# Patient Record
Sex: Female | Born: 1985 | Race: Black or African American | Hispanic: No | Marital: Single | Smoking: Current some day smoker
Health system: Southern US, Community
[De-identification: ages and names within clinical notes are randomized; demographics above are authoritative.]

## PROBLEM LIST (undated history)

## (undated) DIAGNOSIS — N289 Disorder of kidney and ureter, unspecified: Secondary | ICD-10-CM

## (undated) HISTORY — PX: TUBAL LIGATION: SHX77

---

## 2013-09-05 ENCOUNTER — Emergency Department (HOSPITAL_COMMUNITY): Payer: Medicaid Other

## 2013-09-05 ENCOUNTER — Emergency Department (HOSPITAL_COMMUNITY)
Admission: EM | Admit: 2013-09-05 | Discharge: 2013-09-05 | Disposition: A | Discharge status: Home / Self Care | Payer: Medicaid Other | Attending: Emergency Medicine | Admitting: Emergency Medicine

## 2013-09-05 ENCOUNTER — Encounter (HOSPITAL_COMMUNITY): Payer: Self-pay | Admitting: Emergency Medicine

## 2013-09-05 DIAGNOSIS — R110 Nausea: Secondary | ICD-10-CM | POA: Insufficient documentation

## 2013-09-05 DIAGNOSIS — Z880 Allergy status to penicillin: Secondary | ICD-10-CM | POA: Insufficient documentation

## 2013-09-05 DIAGNOSIS — Z3202 Encounter for pregnancy test, result negative: Secondary | ICD-10-CM | POA: Insufficient documentation

## 2013-09-05 DIAGNOSIS — D72829 Elevated white blood cell count, unspecified: Secondary | ICD-10-CM

## 2013-09-05 DIAGNOSIS — N12 Tubulo-interstitial nephritis, not specified as acute or chronic: Secondary | ICD-10-CM

## 2013-09-05 LAB — COMPREHENSIVE METABOLIC PANEL
ALT: 10 U/L (ref 0–35)
AST: 15 U/L (ref 0–37)
Albumin: 3.7 g/dL (ref 3.5–5.2)
Alkaline Phosphatase: 67 U/L (ref 39–117)
BILIRUBIN TOTAL: 1.2 mg/dL (ref 0.3–1.2)
BUN: 8 mg/dL (ref 6–23)
CO2: 22 meq/L (ref 19–32)
CREATININE: 1 mg/dL (ref 0.50–1.10)
Calcium: 9.3 mg/dL (ref 8.4–10.5)
Chloride: 101 mEq/L (ref 96–112)
GFR, EST AFRICAN AMERICAN: 90 mL/min — AB (ref >90)
GFR, EST NON AFRICAN AMERICAN: 78 mL/min — AB (ref >90)
Glucose, Bld: 94 mg/dL (ref 70–99)
Potassium: 3.9 mEq/L (ref 3.7–5.3)
Sodium: 138 mEq/L (ref 137–147)
Total Protein: 7.7 g/dL (ref 6.0–8.3)

## 2013-09-05 LAB — CBC WITH DIFFERENTIAL/PLATELET
BASOS ABS: 0 10*3/uL (ref 0.0–0.1)
Basophils Relative: 0 % (ref 0–1)
EOS PCT: 0 % (ref 0–5)
Eosinophils Absolute: 0 10*3/uL (ref 0.0–0.7)
HEMATOCRIT: 35 % — AB (ref 36.0–46.0)
Hemoglobin: 11.3 g/dL — ABNORMAL LOW (ref 12.0–15.0)
LYMPHS PCT: 12 % (ref 12–46)
Lymphs Abs: 1.4 10*3/uL (ref 0.7–4.0)
MCH: 26.6 pg (ref 26.0–34.0)
MCHC: 32.3 g/dL (ref 30.0–36.0)
MCV: 82.4 fL (ref 78.0–100.0)
Monocytes Absolute: 1.5 10*3/uL — ABNORMAL HIGH (ref 0.1–1.0)
Monocytes Relative: 13 % — ABNORMAL HIGH (ref 3–12)
Neutro Abs: 8.8 10*3/uL — ABNORMAL HIGH (ref 1.7–7.7)
Neutrophils Relative %: 75 % (ref 43–77)
Platelets: 272 10*3/uL (ref 150–400)
RBC: 4.25 MIL/uL (ref 3.87–5.11)
RDW: 14.1 % (ref 11.5–15.5)
WBC: 11.7 10*3/uL — ABNORMAL HIGH (ref 4.0–10.5)

## 2013-09-05 LAB — LIPASE, BLOOD: Lipase: 20 U/L (ref 11–59)

## 2013-09-05 LAB — URINALYSIS, ROUTINE W REFLEX MICROSCOPIC
Glucose, UA: NEGATIVE mg/dL
KETONES UR: 40 mg/dL — AB
Nitrite: POSITIVE — AB
PH: 5 (ref 5.0–8.0)
PROTEIN: 100 mg/dL — AB
Specific Gravity, Urine: 1.015 (ref 1.005–1.030)
Urobilinogen, UA: >8 mg/dL — ABNORMAL HIGH (ref 0.0–1.0)

## 2013-09-05 LAB — URINE MICROSCOPIC-ADD ON

## 2013-09-05 LAB — POC URINE PREG, ED: PREG TEST UR: NEGATIVE

## 2013-09-05 MED ORDER — DEXTROSE 5 % IV SOLN
1.0000 g | Freq: Once | INTRAVENOUS | Status: AC
  Administered 2013-09-05: 1 g via INTRAVENOUS
  Filled 2013-09-05: qty 10

## 2013-09-05 MED ORDER — FENTANYL CITRATE 0.05 MG/ML IJ SOLN
50.0000 ug | Freq: Once | INTRAMUSCULAR | Status: AC
  Administered 2013-09-05: 50 ug via INTRAVENOUS
  Filled 2013-09-05: qty 2

## 2013-09-05 MED ORDER — ONDANSETRON 4 MG PO TBDP: ORAL_TABLET | ORAL | Status: DC

## 2013-09-05 MED ORDER — CEPHALEXIN 500 MG PO CAPS: 500.0000 mg | ORAL_CAPSULE | Freq: Three times a day (TID) | ORAL | Status: DC

## 2013-09-05 MED ORDER — SODIUM CHLORIDE 0.9 % IV BOLUS (SEPSIS)
1000.0000 mL | Freq: Once | INTRAVENOUS | Status: AC
  Administered 2013-09-05: 1000 mL via INTRAVENOUS

## 2013-09-05 MED ORDER — ONDANSETRON HCL 4 MG/2ML IJ SOLN
4.0000 mg | Freq: Once | INTRAMUSCULAR | Status: AC
  Administered 2013-09-05: 4 mg via INTRAVENOUS
  Filled 2013-09-05: qty 2

## 2013-09-05 MED ORDER — OXYCODONE-ACETAMINOPHEN 5-325 MG PO TABS: 1.0000 | ORAL_TABLET | ORAL | Status: DC | PRN

## 2013-09-05 NOTE — ED Notes (Signed)
Pt encouraged to void when able. Pt reports voided prior to arrival.

## 2013-09-05 NOTE — ED Provider Notes (Signed)
CSN: [id number]     Arrival date & time 09/05/13  1126 History   First MD Initiated Contact with Patient 09/05/13 1151     Chief Complaint  Patient presents with  . Flank Pain     (Consider location/radiation/quality/duration/timing/severity/associated sxs/prior Treatment) HPI Comments: 28 year old female presents with severe right-sided back pain. She's been on for the past week. She does have some pain on the left side but is not as bad as the right. She has a history of kidney infection several years ago. She did have body aches, nausea, and chills. She vomited once yesterday. She states a friend states she felt hot yesterday but did not take her temperature. She doesn't have some dysuria but mostly describes as feeling like she still needs to urinate. She's had a mild suprapubic pain as well. She's been taking Alka-Seltzer without any relief. She also started taking Azo without any relief. She notes that her urine has been cloudy. No cough, shortness of breath, or chest pain.   Past Medical History  Diagnosis Date  . Kidney infection    Past Surgical History  Procedure Laterality Date  . Tubal ligation     History reviewed. No pertinent family history. History  Substance Use Topics  . Smoking status: Never Smoker   . Smokeless tobacco: Not on file  . Alcohol Use: No   OB History   Grav Para Term Preterm Abortions TAB SAB Ect Mult Living                 Review of Systems  Constitutional: Positive for chills.  Respiratory: Negative for cough and shortness of breath.   Cardiovascular: Negative for chest pain.  Gastrointestinal: Positive for nausea and abdominal pain. Negative for diarrhea.  Genitourinary: Positive for dysuria, frequency and flank pain. Negative for hematuria and vaginal bleeding.  Musculoskeletal: Positive for back pain.      Allergies  Morphine and related and Penicillins  Home Medications   Current Outpatient Rx  Name  Route  Sig  Dispense  Refill   . DM-Phenylephrine-Acetaminophen (ALKA-SELTZER PLUS DAY COLD/FLU) 10-5-325 MG CAPS   Oral   Take 2 capsules by mouth 2 (two) times daily as needed. Congestion         . phenazopyridine (AZO-STANDARD) 95 MG tablet   Oral   Take 190 mg by mouth 3 (three) times daily as needed for pain.          BP 91/74  Pulse 110  Temp(Src) 98.5 F (36.9 C) (Oral)  Resp 16  SpO2 100%  LMP 08/22/2013 Physical Exam  Vitals reviewed. Constitutional: She is oriented to person, place, and time. She appears well-developed and well-nourished.  Appears uncomfortable, rolling in bed  HENT:  Head: Normocephalic and atraumatic.  Right Ear: External ear normal.  Left Ear: External ear normal.  Nose: Nose normal.  Eyes: Right eye exhibits no discharge. Left eye exhibits no discharge.  Cardiovascular: Normal rate, regular rhythm and normal heart sounds.   Pulmonary/Chest: Effort normal and breath sounds normal.  Abdominal: Soft. She exhibits no distension. There is no tenderness. There is CVA tenderness (right greater than left).  Neurological: She is alert and oriented to person, place, and time.  Skin: Skin is warm and dry.    ED Course  Procedures (including critical care time) Labs Review Labs Reviewed  CBC WITH DIFFERENTIAL - Abnormal; Notable for the following:    WBC 11.7 (*)    Hemoglobin 11.3 (*)    HCT 35.0 (*)  Neutro Abs 8.8 (*)    Monocytes Relative 13 (*)    Monocytes Absolute 1.5 (*)    All other components within normal limits  COMPREHENSIVE METABOLIC PANEL - Abnormal; Notable for the following:    GFR calc non Af Amer 78 (*)    GFR calc Af Amer 90 (*)    All other components within normal limits  URINALYSIS, ROUTINE W REFLEX MICROSCOPIC - Abnormal; Notable for the following:    Color, Urine RED (*)    APPearance TURBID (*)    Hgb urine dipstick TRACE (*)    Bilirubin Urine MODERATE (*)    Ketones, ur 40 (*)    Protein, ur 100 (*)    Urobilinogen, UA >8.0 (*)     Nitrite POSITIVE (*)    Leukocytes, UA LARGE (*)    All other components within normal limits  URINE MICROSCOPIC-ADD ON - Abnormal; Notable for the following:    Bacteria, UA MANY (*)    All other components within normal limits  URINE CULTURE  LIPASE, BLOOD  POC URINE PREG, ED   Imaging Review Ct Abdomen Pelvis Wo Contrast  09/05/2013   CLINICAL DATA:  Right flank pain for 2 weeks, nausea, vomiting, hematuria  EXAM: CT ABDOMEN AND PELVIS WITHOUT CONTRAST  TECHNIQUE: Multidetector CT imaging of the abdomen and pelvis was performed following the standard protocol without intravenous contrast. Sagittal and coronal MPR images reconstructed from axial data set.  COMPARISON:  None  FINDINGS: Minimal subsegmental atelectasis right lung base.  No urinary tract calcification, hydronephrosis or ureteral dilatation.  Bladder decompressed, unremarkable.  Within limits of a nonenhanced exam no focal abnormalities of the liver, spleen, pancreas, kidneys, or adrenal glands.  Minimal pericardial fluid.  Post tubal ligation.  Uterus, adnexae and appendix otherwise unremarkable.  Minimal free pelvic fluid, potentially physiologic.  Multiple pelvic phleboliths.  Stomach and bowel loops normal appearance.  No mass, adenopathy, free fluid or inflammatory process.  IMPRESSION: No acute intra-abdominal or intrapelvic abnormalities.  Minimal pericardial effusion.   Electronically Signed   By: Ulyses SouthwardMark  Boles M.D.   On: 09/05/2013 13:29    EKG Interpretation   None       MDM   Final diagnoses:  Pyelonephritis    Patient rolling around and thrashing on my arrival. Given this with her asymmetric pain, a CT scan was obtained to rule out stone. There is no obvious kidney stone on CT scan. This consistent with pyelonephritis. She's not had any vomiting or fevers here. She was given fluids and one dose of IV Rocephin. She had more thrashing and screaming about 10/10, given pain meds and hospitalist consulted. After pain  meds patient calm and well appearing. Dr Ardyth HarpsHernandez evaluated and feels patient can go home, patient agreeable to trying outpatient abx and pain meds. I feel this is reasonable as well, especially since most of the time patient is calm and not in pain. Will d/c with abx, zofran, and pain meds.    Audree CamelScott T Reynaldo Rossman, MD 09/05/13 [phone]

## 2013-09-05 NOTE — ED Notes (Signed)
Patient transported to CT 

## 2013-09-05 NOTE — ED Notes (Signed)
Pt anxious and tearful at present time. Pt instructed to take deep breaths and given therapeutic conversation.

## 2013-09-05 NOTE — Progress Notes (Signed)
   CARE MANAGEMENT ED NOTE 09/05/2013  Patient:  Suzanne Mccormick,Suzanne Mccormick   Account Number:  [id number]  Date Initiated:  09/05/2013  Documentation initiated by:  Edd ArbourGIBBS,Julien Berryman  Subjective/Objective Assessment:   28 [place] pt c/o rt flank pain x 1 wk.  H/o kidney infection a few years ago.  C/o body aches, NV, lightheadedness, no appetite, chills, hot, etc.  States it hurts when she pees.     Subjective/Objective Assessment Detail:   anxious and tearful in ED EPIC lists address as [place] EDP dx pyelonephritis Pt has her two children younger than 7 with her.  Her son with a bruise near left eye from tripping over his "pigeon toe feet" per pt  UA red, turbid positive for nitrate, too numerous trace of hgb, 40 ketones protein 100 leukocytes large many bacteria wbc CBS wbc = 11.7     Action/Plan:   cm spoke with pt and provided below resources   Action/Plan Detail:   Anticipated DC Date:  09/05/2013     Status Recommendation to Physician:   Result of Recommendation:    Other ED Services  Consult Working Plan    DC Planning Services  PCP issues  Other  Outpatient Services - Pt will follow up    Choice offered to / List presented to:            Status of service:  Completed, signed off  ED Comments:   ED Comments Detail:  CM discussed and provided written information for medicaid & self pay pcps, importance of pcp for f/u care, www.needymeds.org, discounted pharmacies and other [place] resources such as financial assistance, DSS and health department  Reviewed resources for [place] self [place] [facility] like [facility], [facility] [address], [facility], [facility] clinics, [facility] plus others, [facility] out patient pharmacies and housing Pt voiced understanding and appreciation of resources provided  Provided [facility] contact information

## 2013-09-05 NOTE — Consult Note (Signed)
Requesting physician: Sherwood Gambler, EDP  Primary Care Physician: No primary provider on file.  Reason for consultation: Potential admission   History of Present Illness: Pleasant 28 y/o without PMH, presents today with a 3 day history of subjective fevers and chills, right sided flank-pain, dysuria and urgency. She has been given a diagnosis of pyelo in the ED. Not febrile in the ED, WBC count is 11.7, no emesis.  CT scan without contrast, without stone or other intraabdominal pathology. We are asked to see her in consultation for potential admission because of continued pain.  Allergies:   Allergies  Allergen Reactions  . Morphine And Related     hives  . Penicillins     hives      Past Medical History  Diagnosis Date  . Kidney infection     Past Surgical History  Procedure Laterality Date  . Tubal ligation      Scheduled Meds: Continuous Infusions: PRN Meds:.    Social History:  reports that she has never smoked. She has never used smokeless tobacco. She reports that she does not drink alcohol or use illicit drugs.  Family History  Problem Relation Age of Onset  . Hypertension Mother     Review of Systems:  Constitutional: Positive for subjective fever, chills, diaphoresis, appetite change and fatigue.  HEENT: Denies photophobia, eye pain, redness, hearing loss, ear pain, congestion, sore throat, rhinorrhea, sneezing, mouth sores, trouble swallowing, neck pain, neck stiffness and tinnitus.   Respiratory: Denies SOB, DOE, cough, chest tightness,  and wheezing.   Cardiovascular: Denies chest pain, palpitations and leg swelling.  Gastrointestinal: Denies nausea, vomiting, abdominal pain, diarrhea, constipation, blood in stool and abdominal distention.  Genitourinary: Positive for dysuria, urgency, frequency, , flank pain and difficulty urinating.  Endocrine: Denies: hot or cold intolerance, sweats, changes in hair or nails, polyuria, polydipsia. Musculoskeletal:  Denies myalgias, back pain, joint swelling, arthralgias and gait problem.  Skin: Denies pallor, rash and wound.  Neurological: Denies dizziness, seizures, syncope, weakness, light-headedness, numbness and headaches.  Hematological: Denies adenopathy. Easy bruising, personal or family bleeding history  Psychiatric/Behavioral: Denies suicidal ideation, mood changes, confusion, nervousness, sleep disturbance and agitation   Physical Exam: Blood pressure 128/69, pulse 89, temperature 98.5 F (36.9 C), temperature source Oral, resp. rate 20, last menstrual period 08/22/2013, SpO2 100.00%. Gen: AA Ox3, NAD HEENT: [place]/AT/PERRL/moist mucous membranes Neck: supple, no JVD, no LAD, no bruits, no goiter. CV: RRR, no M/R/G Lungs: CTA B Abd: S/right CVA tenderness/ND/+BS Ext: no C/C/E/+pedal pulses Neuro: intact and non-focal.  Labs on Admission:  Results for orders placed during the hospital encounter of 09/05/13 (from the past 48 hour(s))  CBC WITH DIFFERENTIAL     Status: Abnormal   Collection Time    09/05/13 11:48 AM      Result Value Ref Range   WBC 11.7 (*) 4.0 - 10.5 K/uL   RBC 4.25  3.87 - 5.11 MIL/uL   Hemoglobin 11.3 (*) 12.0 - 15.0 g/dL   HCT 35.0 (*) 36.0 - 46.0 %   MCV 82.4  78.0 - 100.0 fL   MCH 26.6  26.0 - 34.0 pg   MCHC 32.3  30.0 - 36.0 g/dL   RDW 14.1  11.5 - 15.5 %   Platelets 272  150 - 400 K/uL   Neutrophils Relative % 75  43 - 77 %   Neutro Abs 8.8 (*) 1.7 - 7.7 K/uL   Lymphocytes Relative 12  12 - 46 %   Lymphs Abs 1.4  0.7 - 4.0 K/uL   Monocytes Relative 13 (*) 3 - 12 %   Monocytes Absolute 1.5 (*) 0.1 - 1.0 K/uL   Eosinophils Relative 0  0 - 5 %   Eosinophils Absolute 0.0  0.0 - 0.7 K/uL   Basophils Relative 0  0 - 1 %   Basophils Absolute 0.0  0.0 - 0.1 K/uL  COMPREHENSIVE METABOLIC PANEL     Status: Abnormal   Collection Time    09/05/13 11:48 AM      Result Value Ref Range   Sodium 138  137 - 147 mEq/L   Potassium 3.9  3.7 - 5.3 mEq/L   Chloride 101   96 - 112 mEq/L   CO2 22  19 - 32 mEq/L   Glucose, Bld 94  70 - 99 mg/dL   BUN 8  6 - 23 mg/dL   Creatinine, Ser 1.00  0.50 - 1.10 mg/dL   Calcium 9.3  8.4 - 10.5 mg/dL   Total Protein 7.7  6.0 - 8.3 g/dL   Albumin 3.7  3.5 - 5.2 g/dL   AST 15  0 - 37 U/L   ALT 10  0 - 35 U/L   Alkaline Phosphatase 67  39 - 117 U/L   Total Bilirubin 1.2  0.3 - 1.2 mg/dL   GFR calc non Af Amer 78 (*) >90 mL/min   GFR calc Af Amer 90 (*) >90 mL/min   Comment: (NOTE)     The eGFR has been calculated using the CKD EPI equation.     This calculation has not been validated in all clinical situations.     eGFR's persistently <90 mL/min signify possible Chronic Kidney     Disease.  LIPASE, BLOOD     Status: None   Collection Time    09/05/13 11:48 AM      Result Value Ref Range   Lipase 20  11 - 59 U/L  URINALYSIS, ROUTINE W REFLEX MICROSCOPIC     Status: Abnormal   Collection Time    09/05/13 12:52 PM      Result Value Ref Range   Color, Urine RED (*) YELLOW   Comment: BIOCHEMICALS MAY BE AFFECTED BY COLOR   APPearance TURBID (*) CLEAR   Specific Gravity, Urine 1.015  1.005 - 1.030   pH 5.0  5.0 - 8.0   Glucose, UA NEGATIVE  NEGATIVE mg/dL   Hgb urine dipstick TRACE (*) NEGATIVE   Bilirubin Urine MODERATE (*) NEGATIVE   Ketones, ur 40 (*) NEGATIVE mg/dL   Protein, ur 100 (*) NEGATIVE mg/dL   Urobilinogen, UA >8.0 (*) 0.0 - 1.0 mg/dL   Nitrite POSITIVE (*) NEGATIVE   Leukocytes, UA LARGE (*) NEGATIVE  URINE MICROSCOPIC-ADD ON     Status: Abnormal   Collection Time    09/05/13 12:52 PM      Result Value Ref Range   Squamous Epithelial / LPF RARE  RARE   WBC, UA TOO NUMEROUS TO COUNT  <3 WBC/hpf   RBC / HPF 0-2  <3 RBC/hpf   Bacteria, UA MANY (*) RARE  POC URINE PREG, ED     Status: None   Collection Time    09/05/13 12:56 PM      Result Value Ref Range   Preg Test, Ur NEGATIVE  NEGATIVE   Comment:            THE SENSITIVITY OF THIS     METHODOLOGY IS >24 mIU/mL    Radiological Exams  on Admission: Ct Abdomen Pelvis Wo Contrast  09/05/2013   CLINICAL DATA:  Right flank pain for 2 weeks, nausea, vomiting, hematuria  EXAM: CT ABDOMEN AND PELVIS WITHOUT CONTRAST  TECHNIQUE: Multidetector CT imaging of the abdomen and pelvis was performed following the standard protocol without intravenous contrast. Sagittal and coronal MPR images reconstructed from axial data set.  COMPARISON:  None  FINDINGS: Minimal subsegmental atelectasis right lung base.  No urinary tract calcification, hydronephrosis or ureteral dilatation.  Bladder decompressed, unremarkable.  Within limits of a nonenhanced exam no focal abnormalities of the liver, spleen, pancreas, kidneys, or adrenal glands.  Minimal pericardial fluid.  Post tubal ligation.  Uterus, adnexae and appendix otherwise unremarkable.  Minimal free pelvic fluid, potentially physiologic.  Multiple pelvic phleboliths.  Stomach and bowel loops normal appearance.  No mass, adenopathy, free fluid or inflammatory process.  IMPRESSION: No acute intra-abdominal or intrapelvic abnormalities.  Minimal pericardial effusion.   Electronically Signed   By: Lavonia Dana M.D.   On: 09/05/2013 13:29    Assessment/Plan Principal Problem:   Pyelonephritis Active Problems:   Leukocytosis   Pyelonephritis -Believe she can be given a trial of OP therapy given lack of emesis, objective fever and patient's willingness to attempt OP treatment. -Would recommend cipro for 7-10 days, as well as PRN zofran and pain meds. -Has been given resources by CM for follow up opportunities. -Advised to return to the ED for failure of improvement in symptoms after at least 3 days of antibiotics, increased pain or fever. -Patient agreeable to above plan. -Discussed with EDP, Dr. Regenia Skeeter.   Time Spent on Consultation: 65 minutes  [facility]  [phone] 09/05/2013, 4:24 PM

## 2013-09-05 NOTE — Discharge Instructions (Signed)

## 2013-09-05 NOTE — ED Notes (Signed)
Pt c/o rt flank pain x 1 wk.  H/o kidney infection a few years ago.  C/o body aches, NV, lightheadedness, no appetite, chills, hot, etc.  States it hurts when she pees.

## 2013-09-07 LAB — URINE CULTURE

## 2013-09-08 ENCOUNTER — Telehealth (HOSPITAL_COMMUNITY): Payer: Self-pay | Admitting: Emergency Medicine

## 2013-09-08 NOTE — ED Notes (Signed)
Post ED Visit - Positive Culture Follow-up  Culture report reviewed by antimicrobial stewardship pharmacist: []  Wes Dulaney, Pharm.D., BCPS []  Celedonio MiyamotoJeremy Frens, [address] BCPS []  Georgina PillionElizabeth Martin, Pharm.D., BCPS []  [place] Springs[place]h Pham, [address]D., BCPS, AAHIVP []  Estella HuskMichelle Turner, Pharm.D., BCPS, AAHIVP [x]  Lysle Pearlachel Rumbarger, Pharm.D., BCPS  Positive urine culture Treated with Keflex, organism sensitive to the same and no further patient follow-up is required at this time.  Zeb ComfortHolland, Shuntell Foody 09/08/2013, 3:40 PM

## 2014-03-07 ENCOUNTER — Emergency Department (HOSPITAL_COMMUNITY): Payer: Medicaid Other

## 2014-03-07 ENCOUNTER — Encounter (HOSPITAL_COMMUNITY): Payer: Self-pay | Admitting: Emergency Medicine

## 2014-03-07 ENCOUNTER — Emergency Department (HOSPITAL_COMMUNITY)
Admission: EM | Admit: 2014-03-07 | Discharge: 2014-03-07 | Disposition: A | Discharge status: Home / Self Care | Payer: Self-pay | Attending: Emergency Medicine | Admitting: Emergency Medicine

## 2014-03-07 DIAGNOSIS — Z8742 Personal history of other diseases of the female genital tract: Secondary | ICD-10-CM | POA: Insufficient documentation

## 2014-03-07 DIAGNOSIS — F17200 Nicotine dependence, unspecified, uncomplicated: Secondary | ICD-10-CM | POA: Insufficient documentation

## 2014-03-07 DIAGNOSIS — R1110 Vomiting, unspecified: Secondary | ICD-10-CM | POA: Insufficient documentation

## 2014-03-07 DIAGNOSIS — R109 Unspecified abdominal pain: Secondary | ICD-10-CM | POA: Insufficient documentation

## 2014-03-07 DIAGNOSIS — Z3202 Encounter for pregnancy test, result negative: Secondary | ICD-10-CM | POA: Insufficient documentation

## 2014-03-07 DIAGNOSIS — N390 Urinary tract infection, site not specified: Secondary | ICD-10-CM | POA: Insufficient documentation

## 2014-03-07 DIAGNOSIS — M549 Dorsalgia, unspecified: Secondary | ICD-10-CM | POA: Insufficient documentation

## 2014-03-07 LAB — URINE MICROSCOPIC-ADD ON

## 2014-03-07 LAB — URINALYSIS, ROUTINE W REFLEX MICROSCOPIC
GLUCOSE, UA: NEGATIVE mg/dL
Hgb urine dipstick: NEGATIVE
Ketones, ur: NEGATIVE mg/dL
Nitrite: POSITIVE — AB
Protein, ur: NEGATIVE mg/dL
SPECIFIC GRAVITY, URINE: 1.029 (ref 1.005–1.030)
Urobilinogen, UA: 1 mg/dL (ref 0.0–1.0)
pH: 6 (ref 5.0–8.0)

## 2014-03-07 LAB — WET PREP, GENITAL
Clue Cells Wet Prep HPF POC: NONE SEEN
TRICH WET PREP: NONE SEEN
YEAST WET PREP: NONE SEEN

## 2014-03-07 LAB — PREGNANCY, URINE: Preg Test, Ur: NEGATIVE

## 2014-03-07 MED ORDER — ONDANSETRON 4 MG PO TBDP
4.0000 mg | ORAL_TABLET | Freq: Once | ORAL | Status: AC
  Administered 2014-03-07: 4 mg via ORAL
  Filled 2014-03-07: qty 1

## 2014-03-07 MED ORDER — IBUPROFEN 800 MG PO TABS
800.0000 mg | ORAL_TABLET | Freq: Once | ORAL | Status: AC
  Administered 2014-03-07: 800 mg via ORAL
  Filled 2014-03-07: qty 1

## 2014-03-07 MED ORDER — SULFAMETHOXAZOLE-TMP DS 800-160 MG PO TABS
1.0000 | ORAL_TABLET | Freq: Once | ORAL | Status: AC
  Administered 2014-03-07: 1 via ORAL
  Filled 2014-03-07: qty 1

## 2014-03-07 MED ORDER — SULFAMETHOXAZOLE-TRIMETHOPRIM 800-160 MG PO TABS: 1.0000 | ORAL_TABLET | Freq: Two times a day (BID) | ORAL | Status: AC

## 2014-03-07 MED ORDER — IBUPROFEN 800 MG PO TABS: 800.0000 mg | ORAL_TABLET | Freq: Three times a day (TID) | ORAL | Status: DC | PRN

## 2014-03-07 MED ORDER — ONDANSETRON HCL 4 MG PO TABS: 4.0000 mg | ORAL_TABLET | Freq: Three times a day (TID) | ORAL | Status: DC | PRN

## 2014-03-07 NOTE — ED Notes (Addendum)
Pt reports lower back pain. Pt reports nausea and reports 3 episodes of vomiting yesterday. Pt reports symptoms similar to that of past pregnancies. Pt reports spotting in June, no period in July, and spotting August 15. Pt denies GU or vaginal symptoms.

## 2014-03-07 NOTE — Discharge Instructions (Signed)

## 2014-03-07 NOTE — ED Notes (Addendum)
Initial contact-A&Ox4. Ambulatory and moving all extremities equally. Speaking full, clear sentences. RR even/unlabored. Hx tubal ligation (Oct. 2012). Took pregnancy test yesterday and reports it was negative "but I saw a faint pink line." States "I started throwing up yesterday. I threw up yesterday three times but not today. I feel extra tired. I didn't even have a period July or August-it was just spotting. And June I had a period." Denies diarrhea, chills. C/o lower abdominal pain bilaterally. C/o lower back pain bilaterally. Has not taken any medications since symptoms started. Is not on regular medications. No alleviating or exacerbating factors noted. Says this feels similar to how she felt in past pregnancies. In NAD. Awaiting MD/PA.

## 2014-03-07 NOTE — ED Provider Notes (Addendum)
TIME SEEN: 3:10 PM  CHIEF COMPLAINT: Lower back pain, vomiting  HPI: Patient is a 28 year old G3 P3 who presents emergency department with lower abdominal pain and back pain, vomiting and feeling fatigued that started yesterday. She describes the symptoms as symptoms that she's had with her prior pregnancies. She is status post BTL in 2012 however. She denies any fevers, diarrhea, vaginal bleeding or discharge. She has had chills. She states she's had Chlamydia and gonorrhea in the past has been treated. She is sexually active with one female partner. They do not use protection. She states her last menstrual period was August 15. States she was recently seen in the hospital department and had pelvic exam. She states her STD testing was negative. She was told she had a bacterial infection however and started on antibiotics but she cannot remember the name of this medication.  ROS: See HPI Constitutional: no fever  Eyes: no drainage  ENT: no runny nose   Cardiovascular:  no chest pain  Resp: no SOB  GI: no vomiting GU: no dysuria Integumentary: no rash  Allergy: no hives  Musculoskeletal: no leg swelling  Neurological: no slurred speech ROS otherwise negative  PAST MEDICAL HISTORY/PAST SURGICAL HISTORY:  Past Medical History  Diagnosis Date  . Kidney infection     MEDICATIONS:  Prior to Admission medications   Medication Sig Start Date End Date Taking? Authorizing Provider  Hyprom-Naphaz-Polysorb-Zn Sulf (CLEAR EYES COMPLETE OP) Apply 2 drops to eye once as needed (drye eyes).   Yes Historical Provider, MD  metroNIDAZOLE (FLAGYL) 250 MG tablet Take 250 mg by mouth 2 (two) times daily. 03/03/14 03/09/14 Yes Historical Provider, MD    ALLERGIES:  Allergies  Allergen Reactions  . Morphine And Related     hives  . Penicillins     hives    SOCIAL HISTORY:  History  Substance Use Topics  . Smoking status: Current Some Day Smoker  . Smokeless tobacco: Never Used  . Alcohol Use: Yes      Comment: occasionally     FAMILY HISTORY: Family History  Problem Relation Age of Onset  . Hypertension Mother     EXAM: BP 100/53  Pulse 72  Temp(Src) 98 F (36.7 C) (Oral)  Resp 16  SpO2 100%  LMP 02/22/2014 CONSTITUTIONAL: Alert and oriented and responds appropriately to questions. Well-appearing; well-nourished HEAD: Normocephalic EYES: Conjunctivae clear, PERRL ENT: normal nose; no rhinorrhea; moist mucous membranes; pharynx without lesions noted NECK: Supple, no meningismus, no LAD  CARD: RRR; S1 and S2 appreciated; no murmurs, no clicks, no rubs, no gallops RESP: Normal chest excursion without splinting or tachypnea; breath sounds clear and equal bilaterally; no wheezes, no rhonchi, no rales,  ABD/GI: Normal bowel sounds; non-distended; soft, tender to palpation diffusely across the lower abdomen without guarding or rebound, no peritoneal signs GU:  Normal external genitalia, patient has bilateral adnexal tenderness that is worse on the left, no adnexal fullness, no cervical motion tenderness, patient has a clear/white liquid in her posterior fornix of the vagina, no vaginal bleeding BACK:  The back appears normal and is non-tender to palpation, there is no CVA tenderness EXT: Normal ROM in all joints; non-tender to palpation; no edema; normal capillary refill; no cyanosis    SKIN: Normal color for age and race; warm NEURO: Moves all extremities equally PSYCH: The patient's mood and manner are appropriate. Grooming and personal hygiene are appropriate.  MEDICAL DECISION MAKING: Patient here with lower abdominal pain, back pain and adnexal tenderness. Urine  pregnancy test is negative. She does appear to have a nitrite-positive UTI. Pelvic exam is concerning as she does have very significant bilateral adnexal tenderness or small left. Although this may be because of her UTI, will obtain a vaginal ultrasound with Doppler to rule out TOA, cysts, torsion. Patient is otherwise  well-appearing. Doubt pyelonephritis.  ED PROGRESS: Pelvic ultrasound unremarkable other than some pelvic varicosities which can be seen with pelvic congestion syndrome. She does have a history of chronic pain in her pelvis. There is no sign of torsion, TIA or cyst. I feel she is safe to be discharged home. We'll discharge with prescription for Bactrim for her UTI. Culture pending. Will discharge with ibuprofen and Zofran. She is comfortable with this plan. She is a nontoxic appearing, hemodynamically stable, smiling and in no distress. Have discussed strict return precautions. She verbalized understanding and is comfortable with plan.     Layla Maw Ward, DO 03/07/14 1744  Layla Maw Ward, DO 03/07/14 1745

## 2014-03-07 NOTE — Progress Notes (Signed)
  CARE MANAGEMENT ED NOTE 03/07/2014  Patient:  Suzanne Mccormick, Suzanne Mccormick   Account Number:  [id number]  Date Initiated:  03/07/2014  Documentation initiated by:  Edd Arbour  Subjective/Objective Assessment:   28 yr old pt with listed [place] address no pcp Pt states she not longer has medicaid coverage and is now in [place] but did not updated address as walking out door     Subjective/Objective Assessment Detail:   [facility] ED AM CM spoke with pt on 09/05/13 when she was dx with Pyelonephritis in [facility] ED and discussed medicais and self pay resources for [facility] at that time & [facility] CC informatoin     Action/Plan:   CM provided pt with self pay resources for [employer] and [facility] information   Action/Plan Detail:   Anticipated DC Date:  03/07/2014     Status Recommendation to Physician:   Result of Recommendation:    Other ED Services  Consult Working Plan    DC Planning Services  Other  PCP issues  Outpatient Services - Pt will follow up    Choice offered to / List presented to:            Status of service:  Completed, signed off  ED Comments:   ED Comments Detail:

## 2014-03-09 LAB — URINE CULTURE: Colony Count: 40000

## 2014-03-10 LAB — GC/CHLAMYDIA PROBE AMP
CT Probe RNA: NEGATIVE
GC PROBE AMP APTIMA: NEGATIVE

## 2014-05-06 ENCOUNTER — Encounter (HOSPITAL_COMMUNITY): Payer: Self-pay | Admitting: Emergency Medicine

## 2014-05-06 ENCOUNTER — Emergency Department (HOSPITAL_COMMUNITY)
Admission: EM | Admit: 2014-05-06 | Discharge: 2014-05-06 | Discharge status: Against Medical Advice | Payer: Medicaid Other | Attending: Emergency Medicine | Admitting: Emergency Medicine

## 2014-05-06 ENCOUNTER — Emergency Department (HOSPITAL_COMMUNITY): Payer: Medicaid Other

## 2014-05-06 ENCOUNTER — Emergency Department (HOSPITAL_COMMUNITY)
Admission: EM | Admit: 2014-05-06 | Discharge: 2014-05-06 | Disposition: A | Discharge status: Home / Self Care | Payer: Medicaid Other | Attending: Emergency Medicine | Admitting: Emergency Medicine

## 2014-05-06 DIAGNOSIS — Z720 Tobacco use: Secondary | ICD-10-CM | POA: Insufficient documentation

## 2014-05-06 DIAGNOSIS — S0990XA Unspecified injury of head, initial encounter: Secondary | ICD-10-CM | POA: Insufficient documentation

## 2014-05-06 DIAGNOSIS — S199XXA Unspecified injury of neck, initial encounter: Secondary | ICD-10-CM | POA: Insufficient documentation

## 2014-05-06 DIAGNOSIS — S299XXA Unspecified injury of thorax, initial encounter: Secondary | ICD-10-CM | POA: Insufficient documentation

## 2014-05-06 DIAGNOSIS — S3992XA Unspecified injury of lower back, initial encounter: Secondary | ICD-10-CM | POA: Insufficient documentation

## 2014-05-06 DIAGNOSIS — Z8742 Personal history of other diseases of the female genital tract: Secondary | ICD-10-CM | POA: Insufficient documentation

## 2014-05-06 MED ORDER — TRAMADOL HCL 50 MG PO TABS: 50.0000 mg | ORAL_TABLET | Freq: Four times a day (QID) | ORAL | Status: DC | PRN

## 2014-05-06 MED ORDER — KETOROLAC TROMETHAMINE 30 MG/ML IJ SOLN
30.0000 mg | Freq: Once | INTRAMUSCULAR | Status: AC
  Administered 2014-05-06: 30 mg via INTRAVENOUS
  Filled 2014-05-06: qty 1

## 2014-05-06 MED ORDER — IBUPROFEN 800 MG PO TABS: 800.0000 mg | ORAL_TABLET | Freq: Three times a day (TID) | ORAL | Status: DC
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
## 2014-05-06 NOTE — ED Notes (Signed)
MD at bedside, warm blanket given
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
## 2014-05-06 NOTE — ED Notes (Signed)
Pt in waiting area now reporting central chest tightness. Pt noted to be tearful. States "I just can't wait this long." EKG ordered per protocol. Pt VSS. NAD.

## 2014-05-06 NOTE — ED Notes (Signed)
Pt alert, oriented, and ambulatory upon dc. She reports she is leaving with ALL belongings she arrived with.
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
## 2014-07-12 ENCOUNTER — Encounter (HOSPITAL_COMMUNITY): Payer: Self-pay | Admitting: Emergency Medicine

## 2014-07-12 ENCOUNTER — Emergency Department (HOSPITAL_COMMUNITY)
Admission: EM | Admit: 2014-07-12 | Discharge: 2014-07-12 | Disposition: A | Discharge status: Home / Self Care | Payer: Medicaid Other | Attending: Emergency Medicine | Admitting: Emergency Medicine

## 2014-07-12 DIAGNOSIS — J029 Acute pharyngitis, unspecified: Secondary | ICD-10-CM | POA: Insufficient documentation

## 2014-07-12 DIAGNOSIS — Z791 Long term (current) use of non-steroidal anti-inflammatories (NSAID): Secondary | ICD-10-CM | POA: Insufficient documentation

## 2014-07-12 DIAGNOSIS — R070 Pain in throat: Secondary | ICD-10-CM

## 2014-07-12 DIAGNOSIS — Z792 Long term (current) use of antibiotics: Secondary | ICD-10-CM | POA: Insufficient documentation

## 2014-07-12 DIAGNOSIS — Z720 Tobacco use: Secondary | ICD-10-CM | POA: Insufficient documentation

## 2014-07-12 DIAGNOSIS — Z8742 Personal history of other diseases of the female genital tract: Secondary | ICD-10-CM | POA: Insufficient documentation

## 2014-07-12 DIAGNOSIS — Z880 Allergy status to penicillin: Secondary | ICD-10-CM | POA: Insufficient documentation

## 2014-07-12 MED ORDER — ONDANSETRON 4 MG PO TBDP
4.0000 mg | ORAL_TABLET | Freq: Once | ORAL | Status: AC
  Administered 2014-07-12: 4 mg via ORAL
  Filled 2014-07-12: qty 1

## 2014-07-12 MED ORDER — AZITHROMYCIN 250 MG PO TABS
2000.0000 mg | ORAL_TABLET | Freq: Once | ORAL | Status: AC
  Administered 2014-07-12: 2000 mg via ORAL
  Filled 2014-07-12: qty 8

## 2014-07-12 MED ORDER — AZITHROMYCIN 250 MG PO TABS: 500.0000 mg | ORAL_TABLET | Freq: Every day | ORAL | Status: DC

## 2014-07-12 MED ORDER — DEXAMETHASONE 6 MG PO TABS
12.0000 mg | ORAL_TABLET | Freq: Once | ORAL | Status: DC
  Filled 2014-07-12: qty 2
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
## 2014-07-12 NOTE — Discharge Instructions (Signed)

## 2014-07-19 ENCOUNTER — Emergency Department (HOSPITAL_COMMUNITY): Payer: Medicaid Other

## 2014-07-19 ENCOUNTER — Encounter (HOSPITAL_COMMUNITY): Payer: Self-pay | Admitting: *Deleted

## 2014-07-19 ENCOUNTER — Emergency Department (HOSPITAL_COMMUNITY)
Admission: EM | Admit: 2014-07-19 | Discharge: 2014-07-19 | Disposition: A | Discharge status: Home / Self Care | Payer: Medicaid Other | Attending: Emergency Medicine | Admitting: Emergency Medicine

## 2014-07-19 DIAGNOSIS — Z3202 Encounter for pregnancy test, result negative: Secondary | ICD-10-CM | POA: Insufficient documentation

## 2014-07-19 DIAGNOSIS — R109 Unspecified abdominal pain: Secondary | ICD-10-CM

## 2014-07-19 DIAGNOSIS — Z720 Tobacco use: Secondary | ICD-10-CM | POA: Insufficient documentation

## 2014-07-19 DIAGNOSIS — Z87448 Personal history of other diseases of urinary system: Secondary | ICD-10-CM | POA: Insufficient documentation

## 2014-07-19 DIAGNOSIS — Z880 Allergy status to penicillin: Secondary | ICD-10-CM | POA: Insufficient documentation

## 2014-07-19 DIAGNOSIS — Z9851 Tubal ligation status: Secondary | ICD-10-CM | POA: Insufficient documentation

## 2014-07-19 DIAGNOSIS — Z792 Long term (current) use of antibiotics: Secondary | ICD-10-CM | POA: Insufficient documentation

## 2014-07-19 DIAGNOSIS — R112 Nausea with vomiting, unspecified: Secondary | ICD-10-CM | POA: Insufficient documentation

## 2014-07-19 DIAGNOSIS — R197 Diarrhea, unspecified: Secondary | ICD-10-CM | POA: Insufficient documentation

## 2014-07-19 LAB — URINALYSIS, ROUTINE W REFLEX MICROSCOPIC
BILIRUBIN URINE: NEGATIVE
Glucose, UA: NEGATIVE mg/dL
Hgb urine dipstick: NEGATIVE
Ketones, ur: 15 mg/dL — AB
Leukocytes, UA: NEGATIVE
Nitrite: NEGATIVE
PROTEIN: NEGATIVE mg/dL
Specific Gravity, Urine: 1.031 — ABNORMAL HIGH (ref 1.005–1.030)
Urobilinogen, UA: 0.2 mg/dL (ref 0.0–1.0)
pH: 5.5 (ref 5.0–8.0)

## 2014-07-19 LAB — CBC WITH DIFFERENTIAL/PLATELET
BASOS PCT: 0 % (ref 0–1)
Basophils Absolute: 0 10*3/uL (ref 0.0–0.1)
Eosinophils Absolute: 0.1 10*3/uL (ref 0.0–0.7)
Eosinophils Relative: 1 % (ref 0–5)
HCT: 37.6 % (ref 36.0–46.0)
Hemoglobin: 11.8 g/dL — ABNORMAL LOW (ref 12.0–15.0)
LYMPHS ABS: 1.9 10*3/uL (ref 0.7–4.0)
Lymphocytes Relative: 31 % (ref 12–46)
MCH: 26.2 pg (ref 26.0–34.0)
MCHC: 31.4 g/dL (ref 30.0–36.0)
MCV: 83.4 fL (ref 78.0–100.0)
MONO ABS: 0.3 10*3/uL (ref 0.1–1.0)
MONOS PCT: 5 % (ref 3–12)
NEUTROS PCT: 63 % (ref 43–77)
Neutro Abs: 3.8 10*3/uL (ref 1.7–7.7)
PLATELETS: 366 10*3/uL (ref 150–400)
RBC: 4.51 MIL/uL (ref 3.87–5.11)
RDW: 12.9 % (ref 11.5–15.5)
WBC: 6.1 10*3/uL (ref 4.0–10.5)

## 2014-07-19 LAB — COMPREHENSIVE METABOLIC PANEL
ALT: 15 U/L (ref 0–35)
AST: 23 U/L (ref 0–37)
Albumin: 4.5 g/dL (ref 3.5–5.2)
Alkaline Phosphatase: 57 U/L (ref 39–117)
Anion gap: 9 (ref 5–15)
BILIRUBIN TOTAL: 1.7 mg/dL — AB (ref 0.3–1.2)
BUN: 15 mg/dL (ref 6–23)
CALCIUM: 9.2 mg/dL (ref 8.4–10.5)
CHLORIDE: 106 meq/L (ref 96–112)
CO2: 23 mmol/L (ref 19–32)
CREATININE: 0.84 mg/dL (ref 0.50–1.10)
GFR calc Af Amer: >90 mL/min (ref >90)
GFR calc non Af Amer: >90 mL/min (ref >90)
Glucose, Bld: 83 mg/dL (ref 70–99)
POTASSIUM: 4 mmol/L (ref 3.5–5.1)
SODIUM: 138 mmol/L (ref 135–145)
TOTAL PROTEIN: 8.1 g/dL (ref 6.0–8.3)

## 2014-07-19 LAB — POC URINE PREG, ED: PREG TEST UR: NEGATIVE

## 2014-07-19 LAB — LIPASE, BLOOD: LIPASE: 27 U/L (ref 11–59)

## 2014-07-19 MED ORDER — LOPERAMIDE HCL 2 MG PO CAPS
4.0000 mg | ORAL_CAPSULE | Freq: Once | ORAL | Status: AC
Start: 2014-07-19 — End: 2014-07-19
  Administered 2014-07-19: 4 mg via ORAL
  Filled 2014-07-19: qty 2

## 2014-07-19 MED ORDER — SODIUM CHLORIDE 0.9 % IV BOLUS (SEPSIS)
1000.0000 mL | Freq: Once | INTRAVENOUS | Status: AC
  Administered 2014-07-19: 1000 mL via INTRAVENOUS

## 2014-07-19 MED ORDER — ONDANSETRON HCL 4 MG PO TABS: 4.0000 mg | ORAL_TABLET | Freq: Four times a day (QID) | ORAL | Status: DC

## 2014-07-19 MED ORDER — HYDROMORPHONE HCL 1 MG/ML IJ SOLN
0.5000 mg | Freq: Once | INTRAMUSCULAR | Status: AC
  Administered 2014-07-19: 0.5 mg via INTRAVENOUS
  Filled 2014-07-19: qty 1

## 2014-07-19 MED ORDER — ONDANSETRON HCL 4 MG/2ML IJ SOLN
4.0000 mg | Freq: Once | INTRAMUSCULAR | Status: AC
  Administered 2014-07-19: 4 mg via INTRAVENOUS
  Filled 2014-07-19: qty 2

## 2014-07-19 NOTE — Discharge Instructions (Signed)
Diarrhea Diarrhea is frequent loose and watery bowel movements. It can cause you to feel weak and dehydrated. Dehydration can cause you to become tired and thirsty, have a dry mouth, and have decreased urination that often is dark yellow. Diarrhea is a sign of another problem, most often an infection that will not last long. In most cases, diarrhea typically lasts 2-3 days. However, it can last longer if it is a sign of something more serious. It is important to treat your diarrhea as directed by your caregiver to lessen or prevent future episodes of diarrhea. CAUSES  Some common causes include:  Gastrointestinal infections caused by viruses, bacteria, or parasites.  Food poisoning or food allergies.  Certain medicines, such as antibiotics, chemotherapy, and laxatives.  Artificial sweeteners and fructose.  Digestive disorders. HOME CARE INSTRUCTIONS  Ensure adequate fluid intake (hydration): Have 1 cup (8 oz) of fluid for each diarrhea episode. Avoid fluids that contain simple sugars or sports drinks, fruit juices, whole milk products, and sodas. Your urine should be clear or pale yellow if you are drinking enough fluids. Hydrate with an oral rehydration solution that you can purchase at pharmacies, retail stores, and online. You can prepare an oral rehydration solution at home by mixing the following ingredients together:   - tsp table salt.   tsp baking soda.   tsp salt substitute containing potassium chloride.  1  tablespoons sugar.  1 L (34 oz) of water.  Certain foods and beverages may increase the speed at which food moves through the gastrointestinal (GI) tract. These foods and beverages should be avoided and include:  Caffeinated and alcoholic beverages.  High-fiber foods, such as raw fruits and vegetables, nuts, seeds, and whole grain breads and cereals.  Foods and beverages sweetened with sugar alcohols, such as xylitol, sorbitol, and mannitol.  Some foods may be well  tolerated and may help thicken stool including:  Starchy foods, such as rice, toast, pasta, low-sugar cereal, oatmeal, grits, baked potatoes, crackers, and bagels.  Bananas.  Applesauce.  Add probiotic-rich foods to help increase healthy bacteria in the GI tract, such as yogurt and fermented milk products.  Wash your hands well after each diarrhea episode.  Only take over-the-counter or prescription medicines as directed by your caregiver.  Take a warm bath to relieve any burning or pain from frequent diarrhea episodes. SEEK IMMEDIATE MEDICAL CARE IF:   You are unable to keep fluids down.  You have persistent vomiting.  You have blood in your stool, or your stools are black and tarry.  You do not urinate in 6-8 hours, or there is only a small amount of very dark urine.  You have abdominal pain that increases or localizes.  You have weakness, dizziness, confusion, or light-headedness.  You have a severe headache.  Your diarrhea gets worse or does not get better.  You have a fever or persistent symptoms for more than 2-3 days.  You have a fever and your symptoms suddenly get worse. MAKE SURE YOU:   Understand these instructions.  Will watch your condition.  Will get help right away if you are not doing well or get worse. Document Released: 06/17/2002 Document Revised: 11/11/2013 Document Reviewed: 03/04/2012 [facility] Patient Information 2015 [place], [place]. This information is not intended to replace advice given to you by your health care provider. Make sure you discuss any questions you have with your health care provider.  Nausea and Vomiting Nausea means you feel sick to your stomach. Throwing up (vomiting) is a  reflex where stomach contents come out of your mouth. HOME CARE   Take medicine as told by your doctor.  Do not force yourself to eat. However, you do need to drink fluids.  If you feel like eating, eat a normal diet as told by your doctor.  Eat  rice, wheat, potatoes, bread, lean meats, yogurt, fruits, and vegetables.  Avoid high-fat foods.  Drink enough fluids to keep your pee (urine) clear or pale yellow.  Ask your doctor how to replace body fluid losses (rehydrate). Signs of body fluid loss (dehydration) include:  Feeling very thirsty.  Dry lips and mouth.  Feeling dizzy.  Dark pee.  Peeing less than normal.  Feeling confused.  Fast breathing or heart rate. GET HELP RIGHT AWAY IF:   You have blood in your throw up.  You have black or bloody poop (stool).  You have a bad headache or stiff neck.  You feel confused.  You have bad belly (abdominal) pain.  You have chest pain or trouble breathing.  You do not pee at least once every 8 hours.  You have cold, clammy skin.  You keep throwing up after 24 to 48 hours.  You have a fever. MAKE SURE YOU:   Understand these instructions.  Will watch your condition.  Will get help right away if you are not doing well or get worse. Document Released: 12/14/2007 Document Revised: 09/19/2011 Document Reviewed: 11/26/2010 [facility] Patient Information 2015 [place], [place]. This information is not intended to replace advice given to you by your health care provider. Make sure you discuss any questions you have with your health care provider.

## 2014-07-19 NOTE — ED Notes (Addendum)
Pt states she has had nausea, diarrhea, weakness for the past 3 days. Pt states she has had diarrhea 3 times in the past 24 hours. Pt states she threw up 3 days ago, but has not had emesis since. Pt denies fever, abd pain.
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
## 2014-10-16 ENCOUNTER — Emergency Department (HOSPITAL_COMMUNITY): Payer: Medicaid Other

## 2014-10-16 ENCOUNTER — Encounter (HOSPITAL_COMMUNITY): Payer: Self-pay | Admitting: Emergency Medicine

## 2014-10-16 ENCOUNTER — Emergency Department (HOSPITAL_COMMUNITY)
Admission: EM | Admit: 2014-10-16 | Discharge: 2014-10-16 | Discharge status: Against Medical Advice | Payer: Medicaid Other | Attending: Emergency Medicine | Admitting: Emergency Medicine

## 2014-10-16 DIAGNOSIS — Z792 Long term (current) use of antibiotics: Secondary | ICD-10-CM | POA: Insufficient documentation

## 2014-10-16 DIAGNOSIS — Z87448 Personal history of other diseases of urinary system: Secondary | ICD-10-CM | POA: Insufficient documentation

## 2014-10-16 DIAGNOSIS — Z3202 Encounter for pregnancy test, result negative: Secondary | ICD-10-CM | POA: Insufficient documentation

## 2014-10-16 DIAGNOSIS — Z720 Tobacco use: Secondary | ICD-10-CM | POA: Insufficient documentation

## 2014-10-16 DIAGNOSIS — R200 Anesthesia of skin: Secondary | ICD-10-CM | POA: Insufficient documentation

## 2014-10-16 DIAGNOSIS — R0789 Other chest pain: Secondary | ICD-10-CM | POA: Insufficient documentation

## 2014-10-16 DIAGNOSIS — Z791 Long term (current) use of non-steroidal anti-inflammatories (NSAID): Secondary | ICD-10-CM | POA: Insufficient documentation

## 2014-10-16 DIAGNOSIS — Z880 Allergy status to penicillin: Secondary | ICD-10-CM | POA: Insufficient documentation

## 2014-10-16 LAB — CBC
HCT: 35.6 % — ABNORMAL LOW (ref 36.0–46.0)
Hemoglobin: 11.2 g/dL — ABNORMAL LOW (ref 12.0–15.0)
MCH: 26.4 pg (ref 26.0–34.0)
MCHC: 31.5 g/dL (ref 30.0–36.0)
MCV: 84 fL (ref 78.0–100.0)
PLATELETS: 272 10*3/uL (ref 150–400)
RBC: 4.24 MIL/uL (ref 3.87–5.11)
RDW: 13.7 % (ref 11.5–15.5)
WBC: 4.1 10*3/uL (ref 4.0–10.5)

## 2014-10-16 LAB — BASIC METABOLIC PANEL
ANION GAP: 6 (ref 5–15)
BUN: 14 mg/dL (ref 6–23)
CALCIUM: 8.8 mg/dL (ref 8.4–10.5)
CHLORIDE: 107 mmol/L (ref 96–112)
CO2: 25 mmol/L (ref 19–32)
CREATININE: 0.74 mg/dL (ref 0.50–1.10)
GFR calc Af Amer: >90 mL/min (ref >90)
GFR calc non Af Amer: >90 mL/min (ref >90)
Glucose, Bld: 88 mg/dL (ref 70–99)
Potassium: 3.8 mmol/L (ref 3.5–5.1)
Sodium: 138 mmol/L (ref 135–145)

## 2014-10-16 LAB — I-STAT TROPONIN, ED: Troponin i, poc: 0 ng/mL (ref 0.00–0.08)

## 2014-10-16 LAB — D-DIMER, QUANTITATIVE (NOT AT ARMC)

## 2014-10-16 LAB — POC URINE PREG, ED: PREG TEST UR: NEGATIVE

## 2014-10-16 MED ORDER — IBUPROFEN 800 MG PO TABS
800.0000 mg | ORAL_TABLET | Freq: Once | ORAL | Status: AC
  Administered 2014-10-16: 800 mg via ORAL
  Filled 2014-10-16: qty 1

## 2014-10-16 NOTE — ED Provider Notes (Signed)
CSN: [id number]     Arrival date & time 10/16/14  1023 History   First MD Initiated Contact with Patient 10/16/14 1349     Chief Complaint  Patient presents with  . Chest Pain  . Numbness     (Consider location/radiation/quality/duration/timing/severity/associated sxs/prior Treatment) HPI Complains of anterior chest pain upon awakening this morning at 8:30 AM. Pain worse with deep inspiration improved with remaining still. Worse with changing positions. Accompanying symptoms include numbness in her right arm and tingling in fingers of her right hand which worsens with shaking her arm. No shortness of breath no nausea no sweatiness symptoms not made worse with exertion no cough no fever no treatment prior to coming here. No other associated symptoms. Cardiac risk factors smoker otherwise negative Past Medical History  Diagnosis Date  . Kidney infection    Past Surgical History  Procedure Laterality Date  . Tubal ligation     Family History  Problem Relation Age of Onset  . Hypertension Mother    History  Substance Use Topics  . Smoking status: Current Some Day Smoker -- 0.50 packs/day    Types: Cigarettes  . Smokeless tobacco: Never Used  . Alcohol Use: Yes     Comment: occasionally    denies illicit drug use OB History    No data available     Review of Systems  Cardiovascular: Positive for chest pain.  Neurological: Positive for numbness.       Numbness in right hand and arm  All other systems reviewed and are negative.     Allergies  Coconut flavor; Morphine and related; and Penicillins  Home Medications   Prior to Admission medications   Medication Sig Start Date End Date Taking? Authorizing Provider  azithromycin (ZITHROMAX) 250 MG tablet Take 2 tablets (500 mg total) by mouth daily. 500 mg daily for 5 days. Patient not taking: Reported on 10/16/2014 07/12/14   Elwin Mocha, MD  ibuprofen (ADVIL,MOTRIN) 800 MG tablet Take 1 tablet (800 mg total) by mouth 3  (three) times daily. Patient not taking: Reported on 07/12/2014 05/06/14   Gerhard Munch, MD  ondansetron (ZOFRAN) 4 MG tablet Take 1 tablet (4 mg total) by mouth every 6 (six) hours. Patient not taking: Reported on 10/16/2014 07/19/14   Raeford Razor, MD  traMADol (ULTRAM) 50 MG tablet Take 1 tablet (50 mg total) by mouth every 6 (six) hours as needed for moderate pain. Patient not taking: Reported on 07/12/2014 05/06/14   Gerhard Munch, MD   BP 122/77 mmHg  Pulse 63  Temp(Src) 98.3 F (36.8 C) (Oral)  Resp 20  SpO2 100%  LMP 09/06/2014 (Exact Date) Physical Exam  Constitutional: She appears well-developed and well-nourished.  HENT:  Head: Normocephalic and atraumatic.  Eyes: Conjunctivae are normal. Pupils are equal, round, and reactive to light.  Neck: Neck supple. No tracheal deviation present. No thyromegaly present.  Cardiovascular: Normal rate, regular rhythm and intact distal pulses.   No murmur heard. Radial pulses 2+ bilaterally  Pulmonary/Chest: Effort normal and breath sounds normal. She exhibits tenderness.  Tender over sternum, reproducing pain exactly. Pain is also reproduced by forcible abduction of right shoulder  Abdominal: Soft. Bowel sounds are normal. She exhibits no distension. There is no tenderness.  Musculoskeletal: Normal range of motion. She exhibits no edema or tenderness.  Motor strength 5 over 5 overall. Right upper extremity without redness swelling or tenderness. Full range of motion. No swelling  Neurological: She is alert. Coordination normal.  Skin: Skin is warm  and dry. No rash noted.  Psychiatric: She has a normal mood and affect.  Nursing note and vitals reviewed.   ED Course  Procedures (including critical care time) Labs Review Labs Reviewed  CBC - Abnormal; Notable for the following:    Hemoglobin 11.2 (*)    HCT 35.6 (*)    All other components within normal limits  BASIC METABOLIC PANEL  I-STAT TROPOININ, ED  POC URINE PREG, ED     Imaging Review Dg Chest 2 View  10/16/2014   CLINICAL DATA:  Chest pain, numbness  EXAM: CHEST  2 VIEW  COMPARISON:  07/19/2014  FINDINGS: Cardiomediastinal silhouette is stable. No acute infiltrate or pleural effusion. No pulmonary edema. Bony thorax is unremarkable.  IMPRESSION: No active cardiopulmonary disease.   Electronically Signed   By: Natasha Mead M.D.   On: 10/16/2014 12:56     EKG Interpretation   Date/Time:  Thursday October 16 2014 10:32:57 EDT Ventricular Rate:  64 PR Interval:  148 QRS Duration: 70 QT Interval:  409 QTC Calculation: 422 R Axis:   35 Text Interpretation:  Sinus rhythm Abnormal Q suggests anterior infarct ST  elev, probable normal early repol pattern Since last tracing rate slower  Confirmed by Ethelda Chick  MD, Dinita Migliaccio [phone]) on 10/16/2014 2:19:12 PM     Ibuprofen ordered . chest x-ray viewed by me. Results for orders placed or performed during the hospital encounter of 10/16/14  CBC  Result Value Ref Range   WBC 4.1 4.0 - 10.5 K/uL   RBC 4.24 3.87 - 5.11 MIL/uL   Hemoglobin 11.2 (L) 12.0 - 15.0 g/dL   HCT 60.4 (L) 54.0 - 98.1 %   MCV 84.0 78.0 - 100.0 fL   MCH 26.4 26.0 - 34.0 pg   MCHC 31.5 30.0 - 36.0 g/dL   RDW 19.1 47.8 - 29.5 %   Platelets 272 150 - 400 K/uL  Basic metabolic panel  Result Value Ref Range   Sodium 138 135 - 145 mmol/L   Potassium 3.8 3.5 - 5.1 mmol/L   Chloride 107 96 - 112 mmol/L   CO2 25 19 - 32 mmol/L   Glucose, Bld 88 70 - 99 mg/dL   BUN 14 6 - 23 mg/dL   Creatinine, Ser 6.21 0.50 - 1.10 mg/dL   Calcium 8.8 8.4 - 30.8 mg/dL   GFR calc non Af Amer >90 >90 mL/min   GFR calc Af Amer >90 >90 mL/min   Anion gap 6 5 - 15  D-dimer, quantitative  Result Value Ref Range   D-Dimer, Quant <0.27 0.00 - 0.48 ug/mL-FEU  I-stat troponin, ED (not at [facility])  Result Value Ref Range   Troponin i, poc 0.00 0.00 - 0.08 ng/mL   Comment 3          POC Urine Pregnancy, ED (pre-menopausal females) - do not order at [facility]  Result Value Ref  Range   Preg Test, Ur NEGATIVE NEGATIVE   Dg Chest 2 View  10/16/2014   CLINICAL DATA:  Chest pain, numbness  EXAM: CHEST  2 VIEW  COMPARISON:  07/19/2014  FINDINGS: Cardiomediastinal silhouette is stable. No acute infiltrate or pleural effusion. No pulmonary edema. Bony thorax is unremarkable.  IMPRESSION: No active cardiopulmonary disease.   Electronically Signed   By: Natasha Mead M.D.   On: 10/16/2014 12:56     MDM  Patient left the emergency department without notifying staff after my evaluation, without waiting for reevaluation. Strongly doubt acute coronary syndrome in this  young female with only risk factor being smoking negative troponin heart score equals 2 based on risk factor and EKG criteria. Low pretest clinical probability for pulmonary embolism. Negative d-dimer. I counseled patient for 5 minutes on smoking cessation Diagnosis #1 atypical chest pain #2 tobacco abuse #3 paresthesias of right arm Final diagnoses:  None        Doug SouSam Quamaine Webb, MD 10/16/14 1550

## 2014-10-16 NOTE — ED Notes (Signed)
MD at bedside. 

## 2014-10-16 NOTE — ED Notes (Signed)
Pt seen leaving  

## 2014-10-16 NOTE — ED Notes (Signed)
Pt c/o central CP radiating to rt arm.  States rt arm is numb.  Sx x 3 hrs.

## 2014-10-16 NOTE — ED Notes (Signed)
Per Tech pt was seen leaving the hospital. MD notified.

## 2014-12-11 ENCOUNTER — Emergency Department (HOSPITAL_COMMUNITY)
Admission: EM | Admit: 2014-12-11 | Discharge: 2014-12-11 | Disposition: A | Discharge status: Home / Self Care | Payer: Medicaid Other | Attending: Emergency Medicine | Admitting: Emergency Medicine

## 2014-12-11 ENCOUNTER — Encounter (HOSPITAL_COMMUNITY): Payer: Self-pay | Admitting: Emergency Medicine

## 2014-12-11 DIAGNOSIS — Z87448 Personal history of other diseases of urinary system: Secondary | ICD-10-CM | POA: Insufficient documentation

## 2014-12-11 DIAGNOSIS — Z791 Long term (current) use of non-steroidal anti-inflammatories (NSAID): Secondary | ICD-10-CM | POA: Insufficient documentation

## 2014-12-11 DIAGNOSIS — Z880 Allergy status to penicillin: Secondary | ICD-10-CM | POA: Insufficient documentation

## 2014-12-11 DIAGNOSIS — Z720 Tobacco use: Secondary | ICD-10-CM | POA: Insufficient documentation

## 2014-12-11 DIAGNOSIS — G5601 Carpal tunnel syndrome, right upper limb: Secondary | ICD-10-CM | POA: Insufficient documentation

## 2014-12-11 MED ORDER — NAPROXEN 500 MG PO TABS: 500.0000 mg | ORAL_TABLET | Freq: Two times a day (BID) | ORAL | Status: DC

## 2014-12-11 MED ORDER — TRAMADOL HCL 50 MG PO TABS: 50.0000 mg | ORAL_TABLET | Freq: Four times a day (QID) | ORAL | Status: DC | PRN

## 2014-12-11 NOTE — ED Provider Notes (Signed)
CSN: [id number]     Arrival date & time 12/11/14  1230 History  This chart was scribed for Arthor Captain, PA-C, working with Raeford Razor, MD by Octavia Heir, ED Scribe. This patient was seen in room WTR8/WTR8 and the patient's care was started at 1:02 PM.    No chief complaint on file.    The history is provided by the patient. No language interpreter was used.    HPI Comments: Suzanne Mccormick is a 29 y.o. female who presents to the Emergency Department complaining of intermittent, gradual worsening right arm pain. She reports the pain as being a tingling, burning, and numbing sensation. Pt notes the pain radiates from her arm to her hand and fingers. She says the pain is worse at night time when she lays down to sleep and wakes up with her hand feeling numb. She reports that the only alleviating factor is holding her arm behind her. 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 She denies history of injury or neck pain.  Past Medical History  Diagnosis Date  . Kidney infection    Past Surgical History  Procedure Laterality Date  . Tubal ligation     Family History  Problem Relation Age of Onset  . Hypertension Mother    History  Substance Use Topics  . Smoking status: Current Some Day Smoker -- 0.50 packs/day    Types: Cigarettes  . Smokeless tobacco: Never Used  . Alcohol Use: Yes     Comment: occasionally    OB History    No data available     Review of Systems  Musculoskeletal: Positive for arthralgias. Negative for neck pain.  Neurological: Positive for numbness.      Allergies  Coconut flavor; Morphine and related; and Penicillins  Home Medications   Prior to Admission medications   Medication Sig Start Date End Date Taking? Authorizing Provider  azithromycin (ZITHROMAX) 250 MG tablet Take 2 tablets (500 mg total) by mouth daily. 500 mg daily for 5 days. Patient not taking: Reported on 10/16/2014 07/12/14   Elwin Mocha, MD   ibuprofen (ADVIL,MOTRIN) 800 MG tablet Take 1 tablet (800 mg total) by mouth 3 (three) times daily. Patient not taking: Reported on 07/12/2014 05/06/14   Gerhard Munch, MD  ondansetron (ZOFRAN) 4 MG tablet Take 1 tablet (4 mg total) by mouth every 6 (six) hours. Patient not taking: Reported on 10/16/2014 07/19/14   Raeford Razor, MD  traMADol (ULTRAM) 50 MG tablet Take 1 tablet (50 mg total) by mouth every 6 (six) hours as needed for moderate pain. Patient not taking: Reported on 07/12/2014 05/06/14   Gerhard Munch, MD   Triage vitals: BP 131/77 mmHg  Pulse 84  Temp(Src) 98.3 F (36.8 C) (Oral)  Resp 16  SpO2 100%  LMP 11/10/2014 Physical Exam  Constitutional: She is oriented to person, place, and time. She appears well-developed and well-nourished. No distress.  HENT:  Head: Normocephalic.  Eyes: Conjunctivae are normal. Pupils are equal, round, and reactive to light. No scleral icterus.  Neck: Normal range of motion. Neck supple. No thyromegaly present.  Cardiovascular: Normal rate and regular rhythm.   Pulmonary/Chest: Effort normal and breath sounds normal. No respiratory distress.  Abdominal: Soft. Bowel sounds are normal.  Musculoskeletal: Normal range of motion.  Weakness in right hand with grip Full ROM with fingers including abduction and adduction Positive Phalens test Negative Tinel's test    Neurological: She is alert and oriented to person,  place, and time.  Skin: Skin is warm and dry.  Psychiatric: She has a normal mood and affect. Her behavior is normal.    ED Course  Procedures  DIAGNOSTIC STUDIES: Oxygen Saturation is 100% on RA, normal by my interpretation.  COORDINATION OF CARE:  1:10 PM Discussed treatment plan which includes wrist splint, follow up with an orthopedic specialist/surgeon and anti-inflammatory with pt at bedside and pt agreed to plan.   Labs Review Labs Reviewed - No data to display  Imaging Review No results found.   EKG  Interpretation None      MDM   Final diagnoses:  Carpal tunnel syndrome of right wrist    Patient with carpal tunnel syndrome.  Splint, RICE, tramadol, naproxen. F/u with Ortho.  I personally performed the services described in this documentation, which was scribed in my presence. The recorded information has been reviewed and is accurate.      Arthor Captainbigail Tali Coster, PA-C 12/11/14 1553  Raeford RazorStephen Kohut, MD 12/11/14 1556

## 2014-12-11 NOTE — Discharge Instructions (Signed)
Carpal Tunnel Syndrome The carpal tunnel is a narrow area located on the palm side of your wrist. The tunnel is formed by the wrist bones and ligaments. Nerves, blood vessels, and tendons pass through the carpal tunnel. Repeated wrist motion or certain diseases may cause swelling within the tunnel. This swelling pinches the main nerve in the wrist (median nerve) and causes the painful hand and arm condition called carpal tunnel syndrome. CAUSES   Repeated wrist motions.  Wrist injuries.  Certain diseases like arthritis, diabetes, alcoholism, hyperthyroidism, and kidney failure.  Obesity.  Pregnancy. SYMPTOMS   A "pins and needles" feeling in your fingers or hand, especially in your thumb, index and middle fingers.  Tingling or numbness in your fingers or hand.  An aching feeling in your entire arm, especially when your wrist and elbow are bent for long periods of time.  Wrist pain that goes up your arm to your shoulder.  Pain that goes down into your palm or fingers.  A weak feeling in your hands. DIAGNOSIS  Your health care provider will take your history and perform a physical exam. An electromyography test may be needed. This test measures electrical signals sent out by your nerves into the muscles. The electrical signals are usually slowed by carpal tunnel syndrome. You may also need X-rays. TREATMENT  Carpal tunnel syndrome may clear up by itself. Your health care provider may recommend a wrist splint or medicine such as a nonsteroidal anti-inflammatory medicine. Cortisone injections may help. Sometimes, surgery may be needed to free the pinched nerve.  HOME CARE INSTRUCTIONS   Take all medicine as directed by your health care provider. Only take over-the-counter or prescription medicines for pain, discomfort, or fever as directed by your health care provider.  If you were given a splint to keep your wrist from bending, wear it as directed. It is important to wear the splint at  night. Wear the splint for as long as you have pain or numbness in your hand, arm, or wrist. This may take 1 to 2 months.  Rest your wrist from any activity that may be causing your pain. If your symptoms are work-related, you may need to talk to your employer about changing to a job that does not require using your wrist.  Put ice on your wrist after long periods of wrist activity.  Put ice in a plastic bag.  Place a towel between your skin and the bag.  Leave the ice on for 15-20 minutes, 03-04 times a day.  Keep all follow-up visits as directed by your health care provider. This includes any orthopedic referrals, physical therapy, and rehabilitation. Any delay in getting necessary care could result in a delay or failure of your condition to heal. SEEK IMMEDIATE MEDICAL CARE IF:   You have new, unexplained symptoms.  Your symptoms get worse and are not helped or controlled with medicines. MAKE SURE YOU:   Understand these instructions.  Will watch your condition.  Will get help right away if you are not doing well or get worse. Document Released: 06/24/2000 Document Revised: 11/11/2013 Document Reviewed: 05/13/2011 Exit[employer] Patient Information 2015 [employer]. This information is not intended to replace advice given to you by your health care provider. Make sure you discuss any questions you have with your health care provider.  

## 2014-12-11 NOTE — ED Notes (Signed)
Pt c/o right arm tingling, burning, and intermittent pain x 2 weeks, worse when laying down to sleep. Pt states she has dropped things due to being unable to hold on with her right arm. Denies CP, SOB. Pt states she came in the past to be evaluated, but left due to waiting too long for results.

## 2015-03-21 ENCOUNTER — Encounter (HOSPITAL_COMMUNITY): Payer: Self-pay | Admitting: *Deleted

## 2015-03-21 ENCOUNTER — Emergency Department (HOSPITAL_COMMUNITY): Payer: Medicaid Other

## 2015-03-21 ENCOUNTER — Emergency Department (HOSPITAL_COMMUNITY)
Admission: EM | Admit: 2015-03-21 | Discharge: 2015-03-21 | Disposition: A | Discharge status: Home / Self Care | Payer: Medicaid Other | Attending: Emergency Medicine | Admitting: Emergency Medicine

## 2015-03-21 DIAGNOSIS — Z79899 Other long term (current) drug therapy: Secondary | ICD-10-CM | POA: Insufficient documentation

## 2015-03-21 DIAGNOSIS — Z791 Long term (current) use of non-steroidal anti-inflammatories (NSAID): Secondary | ICD-10-CM | POA: Insufficient documentation

## 2015-03-21 DIAGNOSIS — Z720 Tobacco use: Secondary | ICD-10-CM | POA: Insufficient documentation

## 2015-03-21 DIAGNOSIS — R059 Cough, unspecified: Secondary | ICD-10-CM

## 2015-03-21 DIAGNOSIS — J3489 Other specified disorders of nose and nasal sinuses: Secondary | ICD-10-CM | POA: Insufficient documentation

## 2015-03-21 DIAGNOSIS — R51 Headache: Secondary | ICD-10-CM | POA: Insufficient documentation

## 2015-03-21 DIAGNOSIS — Z87448 Personal history of other diseases of urinary system: Secondary | ICD-10-CM | POA: Insufficient documentation

## 2015-03-21 DIAGNOSIS — R05 Cough: Secondary | ICD-10-CM

## 2015-03-21 DIAGNOSIS — J029 Acute pharyngitis, unspecified: Secondary | ICD-10-CM | POA: Insufficient documentation

## 2015-03-21 DIAGNOSIS — R0981 Nasal congestion: Secondary | ICD-10-CM | POA: Insufficient documentation

## 2015-03-21 DIAGNOSIS — R6889 Other general symptoms and signs: Secondary | ICD-10-CM

## 2015-03-21 MED ORDER — BENZONATATE 100 MG PO CAPS: 100.0000 mg | ORAL_CAPSULE | Freq: Three times a day (TID) | ORAL | Status: DC | PRN

## 2015-03-21 MED ORDER — ACETAMINOPHEN 325 MG PO TABS
650.0000 mg | ORAL_TABLET | Freq: Once | ORAL | Status: AC
  Administered 2015-03-21: 650 mg via ORAL
  Filled 2015-03-21: qty 2

## 2015-03-21 MED ORDER — GUAIFENESIN 100 MG/5ML PO LIQD: 100.0000 mg | ORAL | Status: DC | PRN

## 2015-03-21 MED ORDER — BENZONATATE 100 MG PO CAPS
100.0000 mg | ORAL_CAPSULE | Freq: Once | ORAL | Status: AC
  Administered 2015-03-21: 100 mg via ORAL
  Filled 2015-03-21: qty 1

## 2015-03-21 NOTE — Discharge Instructions (Signed)

## 2015-03-21 NOTE — ED Provider Notes (Signed)
CSN: [id number]     Arrival date & time 03/21/15  1913 History  This chart was scribed for Fayrene Helper, PA-C, working with Laurence Spates, MD by Chestine Spore, ED Scribe. The patient was seen in room WTR5/WTR5 at 7:30 PM.    Chief Complaint  Patient presents with  . Cough  . Pleurisy      The history is provided by the patient. No language interpreter was used.    HPI Comments: Suzanne Mccormick is a 29 y.o. female who presents to the Emergency Department complaining of productive cough onset 3 days. She reports that she has had cold symptoms that began last week and worsened 3 days ago. She notes that her cough is productive of clear sputum and there is pain to her rib area with coughing. She states that she is having associated symptoms of nasal congestion, HA, rhinorrhea x 1 week, sore throat x 1 week, sweats x 1 week, and chills x 1 week. She states that she has tried OTC theraflu with no relief for her symptoms. She denies n/v, rash, trouble swallowing, SOB, and any other symptoms. Pt notes that she smokes 3-4 cigarettes a day and she denies pregnancy at this time. Denies sick contacts. She denies hx of asthma or COPD.   Past Medical History  Diagnosis Date  . Kidney infection    Past Surgical History  Procedure Laterality Date  . Tubal ligation     Family History  Problem Relation Age of Onset  . Hypertension Mother    Social History  Substance Use Topics  . Smoking status: Current Some Day Smoker -- 0.50 packs/day    Types: Cigarettes  . Smokeless tobacco: Never Used  . Alcohol Use: Yes     Comment: occasionally    OB History    No data available     Review of Systems  Constitutional: Positive for chills and diaphoresis.  HENT: Positive for congestion, rhinorrhea and sore throat. Negative for trouble swallowing.   Respiratory: Positive for cough. Negative for shortness of breath.   Gastrointestinal: Negative for nausea and vomiting.  Skin: Negative for color  change, pallor and wound.  Neurological: Positive for headaches.      Allergies  Coconut flavor; Morphine and related; and Penicillins  Home Medications   Prior to Admission medications   Medication Sig Start Date End Date Taking? Authorizing Provider  azithromycin (ZITHROMAX) 250 MG tablet Take 2 tablets (500 mg total) by mouth daily. 500 mg daily for 5 days. Patient not taking: Reported on 10/16/2014 07/12/14   Elwin Mocha, MD  ibuprofen (ADVIL,MOTRIN) 800 MG tablet Take 1 tablet (800 mg total) by mouth 3 (three) times daily. Patient not taking: Reported on 07/12/2014 05/06/14   Gerhard Munch, MD  naproxen (NAPROSYN) 500 MG tablet Take 1 tablet (500 mg total) by mouth 2 (two) times daily with a meal. 12/11/14   Arthor Captain, PA-C  ondansetron (ZOFRAN) 4 MG tablet Take 1 tablet (4 mg total) by mouth every 6 (six) hours. Patient not taking: Reported on 10/16/2014 07/19/14   Raeford Razor, MD  traMADol (ULTRAM) 50 MG tablet Take 1 tablet (50 mg total) by mouth every 6 (six) hours as needed for moderate pain. 12/11/14   Abigail Harris, PA-C   BP 151/67 mmHg  Pulse 104  Temp(Src) 99.8 F (37.7 C) (Oral)  Resp 18  SpO2 99%  LMP 03/03/2015 (Exact Date) Physical Exam  Constitutional: She is oriented to person, place, and time. She appears well-developed and  well-nourished. No distress.  HENT:  Head: Normocephalic and atraumatic.  Right Ear: Tympanic membrane, external ear and ear canal normal.  Left Ear: Tympanic membrane, external ear and ear canal normal.  Nose: Rhinorrhea (mild) present.  Mouth/Throat: Uvula is midline. No oropharyngeal exudate.  No tonsillar enlargement or oropharyngeal exudate  Eyes: EOM are normal.  Neck: Neck supple. No tracheal deviation present.  Cardiovascular: Normal rate, regular rhythm and normal heart sounds.   Pulmonary/Chest: Effort normal. No respiratory distress. She has no wheezes. She has rhonchi. She has no rales.  Scattered rhonchi without wheezes or  rales  Musculoskeletal: Normal range of motion.  Lymphadenopathy:    She has cervical adenopathy.  Neurological: She is alert and oriented to person, place, and time.  Skin: Skin is warm and dry.  Psychiatric: She has a normal mood and affect. Her behavior is normal.  Nursing note and vitals reviewed.   ED Course  Procedures (including critical care time) DIAGNOSTIC STUDIES: Oxygen Saturation is 99% on RA, nl by my interpretation.    COORDINATION OF CARE: 7:33 PM- This is likely flu at this time, will get CXR imaging at this time and tessalon. Will treat patient Discussed treatment plan with pt at bedside which includes CXR and pt agreed to plan.  8:05 PM CXR showing no acute infiltrates.  DOubt PNA.  Low suspicion for PE.  Pt will receive tessalon and guaifenessin.    Imaging Review Dg Chest 2 View  03/21/2015   CLINICAL DATA:  Productive cough beginning 3 days ago. Nasal congestion, headache, sore throat for 1 week. Chills.  EXAM: CHEST  2 VIEW  COMPARISON:  10/16/2014  FINDINGS: The cardiomediastinal silhouette is within normal limits. The lungs are well inflated and clear. There is no evidence of pleural effusion or pneumothorax. No acute osseous abnormality is identified.  IMPRESSION: No active cardiopulmonary disease.   Electronically Signed   By: Sebastian Ache M.D.   On: 03/21/2015 20:40   I have personally reviewed and evaluated these images as part of my medical decision-making.   MDM   Final diagnoses:  Cough  Flu-like symptoms    BP 123/60 mmHg  Pulse 88  Temp(Src) 101.6 F (38.7 C) (Oral)  Resp 18  SpO2 98%  LMP 03/03/2015 (Exact Date) .   I personally performed the services described in this documentation, which was scribed in my presence. The recorded information has been reviewed and is accurate.     Fayrene Helper, PA-C 03/22/15 1610  Laurence Spates, MD 03/22/15 [phone]

## 2015-03-21 NOTE — ED Notes (Signed)
Per [facility] - pt c/o productive cough x3 days w/ clear sputum, pt now c/o bilat rib pain w/ coughing. Pt also experiencing nasal congestion.

## 2015-03-23 ENCOUNTER — Encounter (HOSPITAL_COMMUNITY): Payer: Self-pay | Admitting: Emergency Medicine

## 2015-03-23 ENCOUNTER — Emergency Department (HOSPITAL_COMMUNITY): Payer: No Typology Code available for payment source

## 2015-03-23 ENCOUNTER — Emergency Department (HOSPITAL_COMMUNITY)
Admission: EM | Admit: 2015-03-23 | Discharge: 2015-03-23 | Disposition: A | Discharge status: Home / Self Care | Payer: No Typology Code available for payment source | Attending: Emergency Medicine | Admitting: Emergency Medicine

## 2015-03-23 DIAGNOSIS — S0990XA Unspecified injury of head, initial encounter: Secondary | ICD-10-CM | POA: Diagnosis not present

## 2015-03-23 DIAGNOSIS — Z720 Tobacco use: Secondary | ICD-10-CM | POA: Insufficient documentation

## 2015-03-23 DIAGNOSIS — Z87448 Personal history of other diseases of urinary system: Secondary | ICD-10-CM | POA: Insufficient documentation

## 2015-03-23 DIAGNOSIS — Y92410 Unspecified street and highway as the place of occurrence of the external cause: Secondary | ICD-10-CM | POA: Diagnosis not present

## 2015-03-23 DIAGNOSIS — Z791 Long term (current) use of non-steroidal anti-inflammatories (NSAID): Secondary | ICD-10-CM | POA: Insufficient documentation

## 2015-03-23 DIAGNOSIS — Z3202 Encounter for pregnancy test, result negative: Secondary | ICD-10-CM | POA: Diagnosis not present

## 2015-03-23 DIAGNOSIS — Y998 Other external cause status: Secondary | ICD-10-CM | POA: Insufficient documentation

## 2015-03-23 DIAGNOSIS — Y9389 Activity, other specified: Secondary | ICD-10-CM | POA: Insufficient documentation

## 2015-03-23 DIAGNOSIS — S39012A Strain of muscle, fascia and tendon of lower back, initial encounter: Secondary | ICD-10-CM | POA: Insufficient documentation

## 2015-03-23 DIAGNOSIS — S161XXA Strain of muscle, fascia and tendon at neck level, initial encounter: Secondary | ICD-10-CM | POA: Diagnosis not present

## 2015-03-23 DIAGNOSIS — S199XXA Unspecified injury of neck, initial encounter: Secondary | ICD-10-CM | POA: Diagnosis present

## 2015-03-23 DIAGNOSIS — Z880 Allergy status to penicillin: Secondary | ICD-10-CM | POA: Diagnosis not present

## 2015-03-23 LAB — I-STAT BETA HCG BLOOD, ED (MC, WL, AP ONLY): I-stat hCG, quantitative: <5 m[IU]/mL (ref <5)

## 2015-03-23 MED ORDER — FENTANYL CITRATE (PF) 100 MCG/2ML IJ SOLN: 100.0000 ug | Freq: Once | INTRAMUSCULAR | Status: DC

## 2015-03-23 MED ORDER — PERCOCET 5-325 MG PO TABS: 1.0000 | ORAL_TABLET | Freq: Four times a day (QID) | ORAL | Status: DC | PRN

## 2015-03-23 MED ORDER — FENTANYL CITRATE (PF) 100 MCG/2ML IJ SOLN
100.0000 ug | Freq: Once | INTRAMUSCULAR | Status: DC
  Filled 2015-03-23: qty 2

## 2015-03-23 MED ORDER — OXYCODONE-ACETAMINOPHEN 5-325 MG PO TABS
1.0000 | ORAL_TABLET | Freq: Once | ORAL | Status: AC
  Administered 2015-03-23: 1 via ORAL
  Filled 2015-03-23: qty 1

## 2015-03-23 MED ORDER — IBUPROFEN 800 MG PO TABS: 800.0000 mg | ORAL_TABLET | Freq: Three times a day (TID) | ORAL | Status: DC | PRN

## 2015-03-23 NOTE — ED Notes (Signed)
Pt ambulated to restroom with assistance from family member
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
## 2015-03-23 NOTE — ED Notes (Signed)
Pt family member instructed that young child in the room should  Not be sitting on the rolling stool in the room for risk of falling.
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
## 2015-03-23 NOTE — Discharge Instructions (Signed)
Return here as needed. Follow up with a primary doctor as needed. Ice to the areas that are sore.

## 2015-03-30 ENCOUNTER — Encounter (HOSPITAL_COMMUNITY): Payer: Self-pay | Admitting: Emergency Medicine

## 2015-03-30 ENCOUNTER — Emergency Department (HOSPITAL_COMMUNITY)
Admission: EM | Admit: 2015-03-30 | Discharge: 2015-03-30 | Disposition: A | Discharge status: Home / Self Care | Payer: No Typology Code available for payment source | Attending: Emergency Medicine | Admitting: Emergency Medicine

## 2015-03-30 ENCOUNTER — Emergency Department (HOSPITAL_COMMUNITY): Payer: No Typology Code available for payment source

## 2015-03-30 DIAGNOSIS — S299XXA Unspecified injury of thorax, initial encounter: Secondary | ICD-10-CM | POA: Insufficient documentation

## 2015-03-30 DIAGNOSIS — S3992XA Unspecified injury of lower back, initial encounter: Secondary | ICD-10-CM | POA: Insufficient documentation

## 2015-03-30 DIAGNOSIS — Z791 Long term (current) use of non-steroidal anti-inflammatories (NSAID): Secondary | ICD-10-CM | POA: Diagnosis not present

## 2015-03-30 DIAGNOSIS — Z880 Allergy status to penicillin: Secondary | ICD-10-CM | POA: Insufficient documentation

## 2015-03-30 DIAGNOSIS — Y998 Other external cause status: Secondary | ICD-10-CM | POA: Diagnosis not present

## 2015-03-30 DIAGNOSIS — Z720 Tobacco use: Secondary | ICD-10-CM | POA: Diagnosis not present

## 2015-03-30 DIAGNOSIS — Y92410 Unspecified street and highway as the place of occurrence of the external cause: Secondary | ICD-10-CM | POA: Insufficient documentation

## 2015-03-30 DIAGNOSIS — Z79899 Other long term (current) drug therapy: Secondary | ICD-10-CM | POA: Insufficient documentation

## 2015-03-30 DIAGNOSIS — Z87448 Personal history of other diseases of urinary system: Secondary | ICD-10-CM | POA: Diagnosis not present

## 2015-03-30 DIAGNOSIS — S4991XA Unspecified injury of right shoulder and upper arm, initial encounter: Secondary | ICD-10-CM | POA: Diagnosis not present

## 2015-03-30 DIAGNOSIS — M546 Pain in thoracic spine: Secondary | ICD-10-CM

## 2015-03-30 DIAGNOSIS — Y9389 Activity, other specified: Secondary | ICD-10-CM | POA: Insufficient documentation

## 2015-03-30 MED ORDER — CYCLOBENZAPRINE HCL 10 MG PO TABS
10.0000 mg | ORAL_TABLET | Freq: Once | ORAL | Status: AC
  Administered 2015-03-30: 10 mg via ORAL
  Filled 2015-03-30: qty 1

## 2015-03-30 MED ORDER — CYCLOBENZAPRINE HCL 10 MG PO TABS: 10.0000 mg | ORAL_TABLET | Freq: Three times a day (TID) | ORAL | Status: DC | PRN
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
## 2015-10-22 ENCOUNTER — Emergency Department (HOSPITAL_COMMUNITY): Payer: Self-pay

## 2015-10-22 ENCOUNTER — Encounter (HOSPITAL_COMMUNITY): Payer: Self-pay | Admitting: *Deleted

## 2015-10-22 ENCOUNTER — Emergency Department (HOSPITAL_COMMUNITY)
Admission: EM | Admit: 2015-10-22 | Discharge: 2015-10-22 | Disposition: A | Discharge status: Home / Self Care | Payer: Self-pay | Attending: Emergency Medicine | Admitting: Emergency Medicine

## 2015-10-22 DIAGNOSIS — S4992XA Unspecified injury of left shoulder and upper arm, initial encounter: Secondary | ICD-10-CM | POA: Insufficient documentation

## 2015-10-22 DIAGNOSIS — Z87448 Personal history of other diseases of urinary system: Secondary | ICD-10-CM | POA: Insufficient documentation

## 2015-10-22 DIAGNOSIS — Y9389 Activity, other specified: Secondary | ICD-10-CM | POA: Insufficient documentation

## 2015-10-22 DIAGNOSIS — M25552 Pain in left hip: Secondary | ICD-10-CM

## 2015-10-22 DIAGNOSIS — Y92410 Unspecified street and highway as the place of occurrence of the external cause: Secondary | ICD-10-CM | POA: Insufficient documentation

## 2015-10-22 DIAGNOSIS — S79912A Unspecified injury of left hip, initial encounter: Secondary | ICD-10-CM | POA: Insufficient documentation

## 2015-10-22 DIAGNOSIS — F17210 Nicotine dependence, cigarettes, uncomplicated: Secondary | ICD-10-CM | POA: Insufficient documentation

## 2015-10-22 DIAGNOSIS — M25512 Pain in left shoulder: Secondary | ICD-10-CM

## 2015-10-22 DIAGNOSIS — Z3202 Encounter for pregnancy test, result negative: Secondary | ICD-10-CM | POA: Insufficient documentation

## 2015-10-22 DIAGNOSIS — Y998 Other external cause status: Secondary | ICD-10-CM | POA: Insufficient documentation

## 2015-10-22 LAB — POC URINE PREG, ED: Preg Test, Ur: NEGATIVE

## 2015-10-22 MED ORDER — IBUPROFEN 800 MG PO TABS
800.0000 mg | ORAL_TABLET | Freq: Once | ORAL | Status: AC
  Administered 2015-10-22: 800 mg via ORAL
  Filled 2015-10-22: qty 1

## 2015-10-22 MED ORDER — IBUPROFEN 800 MG PO TABS: 800.0000 mg | ORAL_TABLET | Freq: Three times a day (TID) | ORAL | Status: DC
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
## 2016-03-17 ENCOUNTER — Encounter (HOSPITAL_COMMUNITY): Payer: Self-pay | Admitting: Emergency Medicine

## 2016-03-17 ENCOUNTER — Emergency Department (HOSPITAL_COMMUNITY): Payer: Self-pay

## 2016-03-17 ENCOUNTER — Emergency Department (HOSPITAL_COMMUNITY)
Admission: EM | Admit: 2016-03-17 | Discharge: 2016-03-17 | Disposition: A | Discharge status: Home / Self Care | Payer: Self-pay | Attending: Emergency Medicine | Admitting: Emergency Medicine

## 2016-03-17 DIAGNOSIS — Y999 Unspecified external cause status: Secondary | ICD-10-CM | POA: Insufficient documentation

## 2016-03-17 DIAGNOSIS — Y939 Activity, unspecified: Secondary | ICD-10-CM | POA: Insufficient documentation

## 2016-03-17 DIAGNOSIS — Z79899 Other long term (current) drug therapy: Secondary | ICD-10-CM | POA: Insufficient documentation

## 2016-03-17 DIAGNOSIS — X501XXA Overexertion from prolonged static or awkward postures, initial encounter: Secondary | ICD-10-CM | POA: Insufficient documentation

## 2016-03-17 DIAGNOSIS — Y929 Unspecified place or not applicable: Secondary | ICD-10-CM | POA: Insufficient documentation

## 2016-03-17 DIAGNOSIS — S93401A Sprain of unspecified ligament of right ankle, initial encounter: Secondary | ICD-10-CM | POA: Insufficient documentation

## 2016-03-17 DIAGNOSIS — F17210 Nicotine dependence, cigarettes, uncomplicated: Secondary | ICD-10-CM | POA: Insufficient documentation

## 2016-03-17 MED ORDER — NAPROXEN 500 MG PO TABS: 500.0000 mg | ORAL_TABLET | Freq: Two times a day (BID) | ORAL | 0 refills | Status: DC | PRN

## 2016-03-17 MED ORDER — HYDROCODONE-ACETAMINOPHEN 5-325 MG PO TABS: 1.0000 | ORAL_TABLET | Freq: Four times a day (QID) | ORAL | 0 refills | Status: DC | PRN

## 2016-03-17 MED ORDER — HYDROCODONE-ACETAMINOPHEN 5-325 MG PO TABS
1.0000 | ORAL_TABLET | Freq: Once | ORAL | Status: AC
  Administered 2016-03-17: 1 via ORAL
  Filled 2016-03-17: qty 1

## 2016-03-17 NOTE — ED Provider Notes (Signed)
[facility]-EMERGENCY DEPT Provider Note   CSN: [id number] Arrival date & time: 03/17/16  1810  By signing my name below, I, Soijett Blue, attest that this documentation has been prepared under the direction and in the presence of [employer] Electronically Signed: Soijett Blue, ED Scribe. 03/17/16. 6:47 PM.   History   Chief Complaint Chief Complaint  Patient presents with  . Ankle Pain    pain and swelling in r/ankle    HPI Suzanne Mccormick is a 30 y.o. female who presents to the Emergency Department complaining of right ankle pain onset 6 hours ago. Pt notes that her right knee "gave out" causing her to twist her right ankle. Pt describes her right ankle pain as 10/10, constant, sharp, and it doesn't radiate. She states that standing worsens her right ankle pain. She reports that she has tried elevation, rest, and ice with mild relief of her symptoms. Has not taken any meds PTA. Pt is having associated symptoms of right ankle swelling and limited ROM due to pain. States she initially ambulated after but then after she rested for a brief time she tried to walk again and was unable to put weight into her R ankle. Denies numbness, tingling, weakness, hitting her head, LOC, right foot pain, bruising, redness, wounds, and any other symptoms. Pt denies injuring anything else.   The history is provided by the patient. No language interpreter was used.  Ankle Pain   The incident occurred 3 to 5 hours ago. Injury mechanism: twisted. The pain is present in the right ankle. The quality of the pain is described as sharp. The pain is at a severity of 10/10. The pain is severe. The pain has been constant since onset. Associated symptoms include inability to bear weight and loss of motion (due to pain). Pertinent negatives include no numbness, no muscle weakness, no loss of sensation and no tingling. She reports no foreign bodies present. The symptoms are aggravated by bearing weight. She has tried  elevation, ice and rest for the symptoms. The treatment provided mild relief.    Past Medical History:  Diagnosis Date  . Kidney infection     Patient Active Problem List   Diagnosis Date Noted  . Pyelonephritis 09/05/2013  . Leukocytosis 09/05/2013    Past Surgical History:  Procedure Laterality Date  . TUBAL LIGATION      OB History    No data available       Home Medications    Prior to Admission medications   Medication Sig Start Date End Date Taking? Authorizing Provider  benzonatate (TESSALON) 100 MG capsule Take 1 capsule (100 mg total) by mouth 3 (three) times daily as needed for cough. 03/21/15   Fayrene Helper, PA-C  cyclobenzaprine (FLEXERIL) 10 MG tablet Take 1 tablet (10 mg total) by mouth 3 (three) times daily as needed for muscle spasms. 03/30/15   Pricilla Loveless, MD  guaiFENesin (ROBITUSSIN) 100 MG/5ML liquid Take 5-10 mLs (100-200 mg total) by mouth every 4 (four) hours as needed for congestion. 03/21/15   Fayrene Helper, PA-C  ibuprofen (ADVIL,MOTRIN) 800 MG tablet Take 1 tablet (800 mg total) by mouth 3 (three) times daily. 10/22/15   Mady Gemma, PA-C    Family History Family History  Problem Relation Age of Onset  . Hypertension Mother     Social History Social History  Substance Use Topics  . Smoking status: Current Some Day Smoker    Packs/day: 0.50    Types: Cigarettes  . Smokeless  tobacco: Never Used  . Alcohol use Yes     Comment: occasionally      Allergies   Coconut flavor [flavoring agent]; Morphine and related; and Penicillins   Review of Systems Review of Systems  HENT: Negative for facial swelling (no head injury).   Musculoskeletal: Positive for arthralgias (right ankle) and joint swelling (right ankle).  Skin: Negative for color change and wound.  Allergic/Immunologic: Negative for immunocompromised state.  Neurological: Negative for tingling, syncope, weakness and numbness.  10 Systems reviewed and are negative for acute  change except as noted in the HPI.    Physical Exam Updated Vital Signs BP 123/69 (BP Location: Left Arm)   Pulse 90   Temp 99.2 F (37.3 C) (Oral)   LMP 02/27/2016 (Exact Date)   SpO2 98%   Physical Exam  Constitutional: She is oriented to person, place, and time. Vital signs are normal. She appears well-developed and well-nourished.  Non-toxic appearance. No distress.  Afebrile, nontoxic, NAD  HENT:  Head: Normocephalic and atraumatic.  Mouth/Throat: Mucous membranes are normal.  Eyes: Conjunctivae and EOM are normal. Right eye exhibits no discharge. Left eye exhibits no discharge.  Neck: Normal range of motion. Neck supple.  Cardiovascular: Normal rate and intact distal pulses.   Pulmonary/Chest: Effort normal. No respiratory distress.  Abdominal: Normal appearance. She exhibits no distension.  Musculoskeletal:       Right ankle: She exhibits decreased range of motion (due to pain) and swelling. She exhibits no ecchymosis, no deformity, no laceration and normal pulse. Tenderness. Lateral malleolus tenderness found. Achilles tendon normal.       Feet:  Right ankle with limited ROM due to pain, +swelling, no deformity, with moderate TTP of lateral malleolus but no TTP or swelling of fore foot or calf. No break in skin. No bruising or erythema. No warmth. Achilles intact. Good pedal pulse and cap refill of all toes. Wiggling toes without difficulty. Sensation grossly intact. Soft compartments  Neurological: She is alert and oriented to person, place, and time. She has normal strength. No sensory deficit.  Skin: Skin is warm, dry and intact. No rash noted.  Psychiatric: She has a normal mood and affect. Her behavior is normal.  Nursing note and vitals reviewed.    ED Treatments / Results  DIAGNOSTIC STUDIES: Oxygen Saturation is 98% on RA, nl by my interpretation.    COORDINATION OF CARE: 6:45 PM Discussed treatment plan with pt at bedside which includes right ankle xray and pt  agreed to plan.    Radiology Dg Ankle Complete Right  Result Date: 03/17/2016 CLINICAL DATA:  Initial encounter. 30 y/o female s/p fall on sidewalk, injured RIGHT ankle. Pain and swelling on lateral side. No prior injury, unable to bear weight. Very limited ROM. EXAM: RIGHT ANKLE - COMPLETE 3+ VIEW COMPARISON:  None. FINDINGS: There is no evidence of fracture, dislocation, or joint effusion. There is no evidence of arthropathy or other focal bone abnormality. Soft tissues are unremarkable. IMPRESSION: Negative. Electronically Signed   By: Gaylyn RongWalter  Liebkemann M.D.   On: 03/17/2016 19:26    Procedures Procedures (including critical care time)  Medications Ordered in ED Medications  HYDROcodone-acetaminophen (NORCO/VICODIN) 5-325 MG per tablet 1 tablet (1 tablet Oral Given 03/17/16 1911)     Initial Impression / Assessment and Plan / ED Course  I have reviewed the triage vital signs and the nursing notes.  Pertinent imaging results that were available during my care of the patient were reviewed by me and considered  in my medical decision making (see chart for details).  Clinical Course    30 y.o. female here with R ankle injury, +swelling and TTP to lateral malleolus, no bruising/warmth/erythema, achilles intact, NVI with soft compartments, no deformities or crepitus. Will obtain xray to eval for fx/injury, will give pain meds then reassess shortly  7:34 PM Xray neg. Will give ASO splint for stabilization and crutches for comfort. Will send home with naprosyn/norco for pain. Discussed RICE, tylenol use in addition, and f/up with ortho in 1-2wks for recheck and ongoing management of injury. I explained the diagnosis and have given explicit precautions to return to the ER including for any other new or worsening symptoms. The patient understands and accepts the medical plan as it's been dictated and I have answered their questions. Discharge instructions concerning home care and prescriptions  have been given. The patient is STABLE and is discharged to home in good condition.   Final Clinical Impressions(s) / ED Diagnoses   Final diagnoses:  Right ankle sprain, initial encounter    New Prescriptions New Prescriptions   HYDROCODONE-ACETAMINOPHEN (NORCO) 5-325 MG TABLET    Take 1 tablet by mouth every 6 (six) hours as needed for severe pain.   NAPROXEN (NAPROSYN) 500 MG TABLET    Take 1 tablet (500 mg total) by mouth 2 (two) times daily as needed for mild pain, moderate pain or headache (TAKE WITH MEALS.).   I personally performed the services described in this documentation, which was scribed in my presence. The recorded information has been reviewed and is accurate.     [address] [place], PA-C 03/17/16 1935    Benjiman Core, MD 03/18/16 [phone]

## 2016-03-17 NOTE — Discharge Instructions (Signed)
Wear ankle brace for at least 2 weeks for stabilization of ankle. Use crutches as needed for comfort. Ice and elevate ankle throughout the day, using ice pack for no more than 20 minutes every hour.  Alternate between naprosyn and norco for pain relief. Do not drive or operate machinery with pain medication use. Call orthopedic follow up today or tomorrow to schedule followup appointment for recheck of ongoing ankle pain in 1-2 weeks that can be canceled with a 24-48 hour notice if complete resolution of pain. Return to the ER for changes or worsening symptoms. °  °

## 2016-03-17 NOTE — ED Notes (Signed)
Ortho tech at bedside 

## 2016-03-17 NOTE — ED Triage Notes (Signed)
C/o r/ankle pain and swelling since 130pm today. Pt stated that she felt her r/knee "give out", causing her to fall onto r/ankle. Pain increased since incident. Pt used ice to decrease swelling.

## 2017-03-09 ENCOUNTER — Emergency Department (HOSPITAL_COMMUNITY): Payer: No Typology Code available for payment source

## 2017-03-09 ENCOUNTER — Encounter (HOSPITAL_COMMUNITY): Payer: Self-pay | Admitting: Emergency Medicine

## 2017-03-09 ENCOUNTER — Emergency Department (HOSPITAL_COMMUNITY)
Admission: EM | Admit: 2017-03-09 | Discharge: 2017-03-09 | Disposition: A | Discharge status: Home / Self Care | Payer: No Typology Code available for payment source | Attending: Emergency Medicine | Admitting: Emergency Medicine

## 2017-03-09 DIAGNOSIS — Y999 Unspecified external cause status: Secondary | ICD-10-CM | POA: Insufficient documentation

## 2017-03-09 DIAGNOSIS — S40011A Contusion of right shoulder, initial encounter: Secondary | ICD-10-CM | POA: Diagnosis not present

## 2017-03-09 DIAGNOSIS — F17210 Nicotine dependence, cigarettes, uncomplicated: Secondary | ICD-10-CM | POA: Diagnosis not present

## 2017-03-09 DIAGNOSIS — Y92410 Unspecified street and highway as the place of occurrence of the external cause: Secondary | ICD-10-CM | POA: Diagnosis not present

## 2017-03-09 DIAGNOSIS — Y9389 Activity, other specified: Secondary | ICD-10-CM | POA: Insufficient documentation

## 2017-03-09 DIAGNOSIS — S4991XA Unspecified injury of right shoulder and upper arm, initial encounter: Secondary | ICD-10-CM | POA: Diagnosis present

## 2017-03-09 MED ORDER — CYCLOBENZAPRINE HCL 5 MG PO TABS
5.0000 mg | ORAL_TABLET | Freq: Three times a day (TID) | ORAL | 0 refills | Status: AC | PRN
Start: 2017-03-09 — End: ?

## 2017-03-09 MED ORDER — NAPROXEN 375 MG PO TABS: 375.0000 mg | ORAL_TABLET | Freq: Two times a day (BID) | ORAL | 0 refills | Status: DC

## 2017-03-09 NOTE — Discharge Instructions (Signed)
Do not take the muscle relaxant if driving as it will make you sleepy. Return as needed for worsening symptoms °
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
## 2017-03-21 ENCOUNTER — Encounter (HOSPITAL_COMMUNITY): Payer: Self-pay | Admitting: Emergency Medicine

## 2017-03-21 ENCOUNTER — Emergency Department (HOSPITAL_COMMUNITY)
Admission: EM | Admit: 2017-03-21 | Discharge: 2017-03-21 | Disposition: A | Discharge status: Home / Self Care | Payer: Self-pay | Attending: Emergency Medicine | Admitting: Emergency Medicine

## 2017-03-21 ENCOUNTER — Emergency Department (HOSPITAL_COMMUNITY): Payer: Self-pay

## 2017-03-21 DIAGNOSIS — F17210 Nicotine dependence, cigarettes, uncomplicated: Secondary | ICD-10-CM | POA: Insufficient documentation

## 2017-03-21 DIAGNOSIS — M79601 Pain in right arm: Secondary | ICD-10-CM | POA: Insufficient documentation

## 2017-03-21 MED ORDER — NAPROXEN 500 MG PO TABS: 500.0000 mg | ORAL_TABLET | Freq: Two times a day (BID) | ORAL | 0 refills | Status: AC

## 2017-03-21 NOTE — ED Triage Notes (Addendum)
Patient c/o right arm pain with numbness and tingling x1 year, worsening x3 weeks. Patient reports it is preventing her from doing her job. States she was seen x2 weeks ago for same. Patient has full ROM to right arm.

## 2017-03-21 NOTE — ED Provider Notes (Addendum)
[facility]-EMERGENCY DEPT Provider Note   CSN: [id number] Arrival date & time: 03/21/17  1043     History   Chief Complaint Chief Complaint  Patient presents with  . Arm Pain    HPI Suzanne Mccormick is a 31 y.o. female.  Pt complains of pain in her right wrist.  Pt makes biscuits at work   The history is provided by the patient. No language interpreter was used.  Arm Injury   This is a new problem. The current episode started more than 1 week ago. The problem occurs constantly. The problem has not changed since onset.Pain location: right arm. The pain is at a severity of 5/10. The pain is moderate. Pertinent negatives include full range of motion.    Past Medical History:  Diagnosis Date  . Kidney infection     Patient Active Problem List   Diagnosis Date Noted  . Pyelonephritis 09/05/2013  . Leukocytosis 09/05/2013    Past Surgical History:  Procedure Laterality Date  . TUBAL LIGATION      OB History    No data available       Home Medications    Prior to Admission medications   Medication Sig Start Date End Date Taking? Authorizing Provider  acetaminophen (TYLENOL) 325 MG tablet Take 650 mg by mouth every 6 (six) hours as needed for mild pain.   Yes [provider]  cyclobenzaprine (FLEXERIL) 5 MG tablet Take 1 tablet (5 mg total) by mouth 3 (three) times daily as needed for muscle spasms. Patient not taking: Reported on 03/21/2017 03/09/17   Janne NapoleonNeese, Hope M, NP  naproxen (NAPROSYN) 500 MG tablet Take 1 tablet (500 mg total) by mouth 2 (two) times daily. 03/21/17   Bethann BerkshireZammit, Leasha Goldberger, MD    Family History Family History  Problem Relation Age of Onset  . Hypertension Mother     Social History Social History  Substance Use Topics  . Smoking status: Current Some Day Smoker    Packs/day: 0.50    Types: Cigarettes  . Smokeless tobacco: Never Used  . Alcohol use Yes     Comment: occasionally      Allergies   Coconut flavor [flavoring agent]; Morphine  and related; and Penicillins   Review of Systems Review of Systems  Constitutional: Negative for chills and fever.  HENT: Negative for ear pain and sore throat.   Eyes: Negative for pain and visual disturbance.  Respiratory: Negative for cough and shortness of breath.   Cardiovascular: Negative for chest pain and palpitations.  Gastrointestinal: Negative for abdominal pain and vomiting.  Genitourinary: Negative for dysuria and hematuria.  Musculoskeletal: Negative for arthralgias and back pain.       Wrist pain  Skin: Negative for color change and rash.  Neurological: Negative for seizures and syncope.  All other systems reviewed and are negative.    Physical Exam Updated Vital Signs BP 126/76 (BP Location: Left Arm)   Pulse 82   Temp 98.9 F (37.2 C)   Resp 18   Ht 5' 6.5" (1.689 m)   Wt 89.4 kg (197 lb)   LMP 02/23/2017   SpO2 99%   BMI 31.32 kg/m   Physical Exam  Constitutional: She is oriented to person, place, and time. She appears well-developed.  HENT:  Head: Normocephalic.  Eyes: Conjunctivae are normal.  Neck: No tracheal deviation present.  Cardiovascular:  No murmur heard. Musculoskeletal: Normal range of motion.  Tender right wrist with pain on flexion and extension radiating to elbow.  Neurological: She is oriented to person, place, and time.  Skin: Skin is warm.  Psychiatric: She has a normal mood and affect.     ED Treatments / Results  Labs (all labs ordered are listed, but only abnormal results are displayed) Labs Reviewed - No data to display  EKG  EKG Interpretation None       Radiology Dg Forearm Right  Result Date: 03/21/2017 CLINICAL DATA:  Right forearm pain for 1 year. EXAM: RIGHT FOREARM - 2 VIEW COMPARISON:  None. FINDINGS: There is no evidence of fracture or other focal bone lesions. Soft tissues are unremarkable. IMPRESSION: No acute osseous injury of the right forearm. Electronically Signed   By: Elige Ko   On:  03/21/2017 15:42    Procedures Procedures (including critical care time)  Medications Ordered in ED Medications - No data to display   Initial Impression / Assessment and Plan / ED Course  I have reviewed the triage vital signs and the nursing notes.  Pertinent labs & imaging results that were available during my care of the patient were reviewed by me and considered in my medical decision making (see chart for details).     Suspect carpal tunnel.  Pt will be put on a splint with light duty and naprosyn.  Referred to hand md  Final Clinical Impressions(s) / ED Diagnoses   Final diagnoses:  Right arm pain    New Prescriptions New Prescriptions   NAPROXEN (NAPROSYN) 500 MG TABLET    Take 1 tablet (500 mg total) by mouth 2 (two) times daily.     Bethann Berkshire, MD 03/21/17 1914    Bethann Berkshire, MD 03/21/17 1623

## 2017-03-21 NOTE — Discharge Instructions (Signed)
Follow up with Dr. Amanda PeaGramig or partner in 1-2 weeks

## 2017-04-21 ENCOUNTER — Encounter (HOSPITAL_COMMUNITY): Payer: Self-pay

## 2017-04-21 ENCOUNTER — Emergency Department (HOSPITAL_COMMUNITY): Payer: Self-pay

## 2017-04-21 ENCOUNTER — Emergency Department (HOSPITAL_COMMUNITY)
Admission: EM | Admit: 2017-04-21 | Discharge: 2017-04-21 | Disposition: A | Discharge status: Home / Self Care | Payer: Self-pay | Attending: Emergency Medicine | Admitting: Emergency Medicine

## 2017-04-21 DIAGNOSIS — F17210 Nicotine dependence, cigarettes, uncomplicated: Secondary | ICD-10-CM | POA: Insufficient documentation

## 2017-04-21 DIAGNOSIS — R079 Chest pain, unspecified: Secondary | ICD-10-CM

## 2017-04-21 DIAGNOSIS — R51 Headache: Secondary | ICD-10-CM | POA: Insufficient documentation

## 2017-04-21 DIAGNOSIS — R519 Headache, unspecified: Secondary | ICD-10-CM

## 2017-04-21 LAB — CBC WITH DIFFERENTIAL/PLATELET
Basophils Absolute: 0 10*3/uL (ref 0.0–0.1)
Basophils Relative: 0 %
Eosinophils Absolute: 0 10*3/uL (ref 0.0–0.7)
Eosinophils Relative: 1 %
HCT: 36.2 % (ref 36.0–46.0)
Hemoglobin: 12 g/dL (ref 12.0–15.0)
Lymphocytes Relative: 41 %
Lymphs Abs: 1.9 10*3/uL (ref 0.7–4.0)
MCH: 27.5 pg (ref 26.0–34.0)
MCHC: 33.1 g/dL (ref 30.0–36.0)
MCV: 83 fL (ref 78.0–100.0)
Monocytes Absolute: 0.3 10*3/uL (ref 0.1–1.0)
Monocytes Relative: 6 %
Neutro Abs: 2.5 10*3/uL (ref 1.7–7.7)
Neutrophils Relative %: 52 %
Platelets: 313 10*3/uL (ref 150–400)
RBC: 4.36 MIL/uL (ref 3.87–5.11)
RDW: 13.2 % (ref 11.5–15.5)
WBC: 4.8 10*3/uL (ref 4.0–10.5)

## 2017-04-21 LAB — BASIC METABOLIC PANEL
Anion gap: 7 (ref 5–15)
BUN: 9 mg/dL (ref 6–20)
CO2: 24 mmol/L (ref 22–32)
Calcium: 8.9 mg/dL (ref 8.9–10.3)
Chloride: 105 mmol/L (ref 101–111)
Creatinine, Ser: 0.83 mg/dL (ref 0.44–1.00)
GFR calc Af Amer: >60 mL/min (ref >60)
GFR calc non Af Amer: >60 mL/min (ref >60)
Glucose, Bld: 75 mg/dL (ref 65–99)
Potassium: 3.8 mmol/L (ref 3.5–5.1)
Sodium: 136 mmol/L (ref 135–145)

## 2017-04-21 LAB — TROPONIN I: Troponin I: <0.03 ng/mL (ref <0.03)

## 2017-04-21 MED ORDER — METOCLOPRAMIDE HCL 5 MG/ML IJ SOLN
10.0000 mg | Freq: Once | INTRAMUSCULAR | Status: AC
  Administered 2017-04-21: 10 mg via INTRAVENOUS
  Filled 2017-04-21: qty 2

## 2017-04-21 MED ORDER — SODIUM CHLORIDE 0.9 % IV BOLUS (SEPSIS)
1000.0000 mL | Freq: Once | INTRAVENOUS | Status: AC
  Administered 2017-04-21: 1000 mL via INTRAVENOUS

## 2017-04-21 MED ORDER — DIPHENHYDRAMINE HCL 50 MG/ML IJ SOLN
12.5000 mg | Freq: Once | INTRAMUSCULAR | Status: AC
  Administered 2017-04-21: 12.5 mg via INTRAVENOUS
  Filled 2017-04-21: qty 1

## 2017-04-21 MED ORDER — KETOROLAC TROMETHAMINE 15 MG/ML IJ SOLN
15.0000 mg | Freq: Once | INTRAMUSCULAR | Status: AC
  Administered 2017-04-21: 15 mg via INTRAVENOUS
  Filled 2017-04-21: qty 1

## 2017-04-21 NOTE — ED Triage Notes (Signed)
Patient c/o constant mid lower chest pain that started last night around 2100. Patient also c/o right sided headache after she woke and was on her way to the Ed. Patient  sensitivity to light. Patient also c/o nausea.

## 2017-04-26 NOTE — ED Provider Notes (Signed)
[facility]-EMERGENCY DEPT Provider Note   CSN: [id number] Arrival date & time: 04/21/17  0944     History   Chief Complaint Chief Complaint  Patient presents with  . Chest Pain  . Headache    HPI Suzanne Mccormick is a 31 y.o. female.  HPI  31 year old female with primarily chest pain and headache. Onset yesterday around 9 PM. Persistent since then. Pain is in the mid to lower chest. Does not radiate. No cough. No dyspnea. No dizziness or lightheadedness. No unusual leg pain or swelling. She is also complaining of a headache. Right-sided. No trauma. No neck pain or stiffness. No numbness or focal weakness. Photophobia. No change in visual acuity.  Past Medical History:  Diagnosis Date  . Kidney infection     Patient Active Problem List   Diagnosis Date Noted  . Pyelonephritis 09/05/2013  . Leukocytosis 09/05/2013    Past Surgical History:  Procedure Laterality Date  . TUBAL LIGATION      OB History    No data available       Home Medications    Prior to Admission medications   Medication Sig Start Date End Date Taking? Authorizing Provider  cyclobenzaprine (FLEXERIL) 5 MG tablet Take 1 tablet (5 mg total) by mouth 3 (three) times daily as needed for muscle spasms. Patient not taking: Reported on 03/21/2017 03/09/17   Janne Napoleon, NP  naproxen (NAPROSYN) 500 MG tablet Take 1 tablet (500 mg total) by mouth 2 (two) times daily. Patient not taking: Reported on 04/21/2017 03/21/17   Bethann Berkshire, MD    Family History Family History  Problem Relation Age of Onset  . Hypertension Mother     Social History Social History  Substance Use Topics  . Smoking status: Current Some Day Smoker    Packs/day: 0.50    Types: Cigarettes  . Smokeless tobacco: Never Used  . Alcohol use Yes     Comment: occasionally      Allergies   Coconut flavor [flavoring agent]; Morphine and related; and Penicillins   Review of Systems Review of  Systems  All systems reviewed and negative, other than as noted in HPI.  Physical Exam Updated Vital Signs BP 116/80 (BP Location: Right Arm)   Pulse 65   Temp 98 F (36.7 C) (Oral)   Resp 16   Ht 5\' 6"  (1.676 m)   Wt 89.4 kg (197 lb)   LMP 04/06/2017   SpO2 99%   BMI 31.80 kg/m   Physical Exam  Constitutional: She appears well-developed and well-nourished. No distress.  HENT:  Head: Normocephalic and atraumatic.  Eyes: Conjunctivae are normal. Right eye exhibits no discharge. Left eye exhibits no discharge.  Neck: Neck supple.  No meningismus  Cardiovascular: Normal rate, regular rhythm and normal heart sounds.  Exam reveals no gallop and no friction rub.   No murmur heard. Pulmonary/Chest: Effort normal and breath sounds normal. No respiratory distress.  Abdominal: Soft. She exhibits no distension. There is no tenderness.  Musculoskeletal: She exhibits no edema or tenderness.  Neurological: She is alert.  Skin: Skin is warm and dry.  Psychiatric: She has a normal mood and affect. Her behavior is normal. Thought content normal.  Nursing note and vitals reviewed.    ED Treatments / Results  Labs (all labs ordered are listed, but only abnormal results are displayed) Labs Reviewed  CBC WITH DIFFERENTIAL/PLATELET  BASIC METABOLIC PANEL  TROPONIN I    EKG  EKG Interpretation None  Radiology No results found.  Procedures Procedures (including critical care time)  Medications Ordered in ED Medications  sodium chloride 0.9 % bolus 1,000 mL (0 mLs Intravenous Stopped 04/21/17 1444)  ketorolac (TORADOL) 15 MG/ML injection 15 mg (15 mg Intravenous Given 04/21/17 1235)  metoCLOPramide (REGLAN) injection 10 mg (10 mg Intravenous Given 04/21/17 1235)  diphenhydrAMINE (BENADRYL) injection 12.5 mg (12.5 mg Intravenous Given 04/21/17 1234)     Initial Impression / Assessment and Plan / ED Course  I have reviewed the triage vital signs and the nursing  notes.  Pertinent labs & imaging results that were available during my care of the patient were reviewed by me and considered in my medical decision making (see chart for details).     31 year old female with non-concerning chest pain. Doubt ACS, PE, dissection or other emergent process. Nontraumatic headache. Afebrile. No meningismus. Nonfocal neurological examination. She does symptomatically much improvement.  Final Clinical Impressions(s) / ED Diagnoses   Final diagnoses:  Nonspecific chest pain  Acute nonintractable headache, unspecified headache type    New Prescriptions Discharge Medication List as of 04/21/2017  2:17 PM       Raeford RazorKohut, Zaara Sprowl, MD 04/26/17 1048

## 2018-05-27 ENCOUNTER — Emergency Department (HOSPITAL_COMMUNITY): Payer: No Typology Code available for payment source

## 2018-05-27 ENCOUNTER — Encounter (HOSPITAL_COMMUNITY): Payer: Self-pay | Admitting: Emergency Medicine

## 2018-05-27 ENCOUNTER — Other Ambulatory Visit: Payer: Self-pay

## 2018-05-27 ENCOUNTER — Emergency Department (HOSPITAL_COMMUNITY)
Admission: EM | Admit: 2018-05-27 | Discharge: 2018-05-27 | Disposition: A | Discharge status: Home / Self Care | Payer: No Typology Code available for payment source | Attending: Emergency Medicine | Admitting: Emergency Medicine

## 2018-05-27 DIAGNOSIS — Y998 Other external cause status: Secondary | ICD-10-CM | POA: Insufficient documentation

## 2018-05-27 DIAGNOSIS — F17210 Nicotine dependence, cigarettes, uncomplicated: Secondary | ICD-10-CM | POA: Diagnosis not present

## 2018-05-27 DIAGNOSIS — S4992XA Unspecified injury of left shoulder and upper arm, initial encounter: Secondary | ICD-10-CM | POA: Diagnosis present

## 2018-05-27 DIAGNOSIS — Y92511 Restaurant or cafe as the place of occurrence of the external cause: Secondary | ICD-10-CM | POA: Diagnosis not present

## 2018-05-27 DIAGNOSIS — S41112A Laceration without foreign body of left upper arm, initial encounter: Secondary | ICD-10-CM

## 2018-05-27 DIAGNOSIS — W268XXA Contact with other sharp object(s), not elsewhere classified, initial encounter: Secondary | ICD-10-CM | POA: Diagnosis not present

## 2018-05-27 DIAGNOSIS — Z23 Encounter for immunization: Secondary | ICD-10-CM | POA: Diagnosis not present

## 2018-05-27 DIAGNOSIS — Y9389 Activity, other specified: Secondary | ICD-10-CM | POA: Diagnosis not present

## 2018-05-27 MED ORDER — LORAZEPAM 2 MG/ML IJ SOLN
1.0000 mg | Freq: Once | INTRAMUSCULAR | Status: AC
  Administered 2018-05-27: 1 mg via INTRAVENOUS

## 2018-05-27 MED ORDER — TETANUS-DIPHTH-ACELL PERTUSSIS 5-2.5-18.5 LF-MCG/0.5 IM SUSP
0.5000 mL | Freq: Once | INTRAMUSCULAR | Status: AC
  Administered 2018-05-27: 0.5 mL via INTRAMUSCULAR
  Filled 2018-05-27: qty 0.5

## 2018-05-27 MED ORDER — LORAZEPAM 2 MG/ML IJ SOLN
INTRAMUSCULAR | Status: AC
  Administered 2018-05-27: 1 mg via INTRAVENOUS
  Filled 2018-05-27: qty 1

## 2018-05-27 MED ORDER — LIDOCAINE-EPINEPHRINE (PF) 2 %-1:200000 IJ SOLN
20.0000 mL | Freq: Once | INTRAMUSCULAR | Status: AC
  Administered 2018-05-27: 20 mL
  Filled 2018-05-27: qty 20

## 2018-05-27 NOTE — ED Notes (Signed)
Bed: WA17 Expected date:  Expected time:  Means of arrival:  Comments: EMS: laceration
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
## 2018-05-27 NOTE — Discharge Instructions (Signed)
You were seen in the ER for laceration.  This was sutured with 7 sutures.  Keep the wound clean with clean water and mild soap.  Tap dry, apply ointment at least twice daily.  Do not bathe or soak in bath tubs or pools.  Can take ibuprofen or acetaminophen for pain.  Monitor for signs of infection including fevers, chills, pus, bleeding.  Sutures need to be removed within 7 to 10 days.

## 2018-05-27 NOTE — ED Notes (Addendum)
Patient refusing a tetanus shot

## 2018-05-27 NOTE — ED Notes (Signed)
ED PA at bedside
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
## 2018-06-09 ENCOUNTER — Encounter (HOSPITAL_COMMUNITY): Payer: Self-pay | Admitting: Emergency Medicine

## 2018-06-09 ENCOUNTER — Emergency Department (HOSPITAL_COMMUNITY)
Admission: EM | Admit: 2018-06-09 | Discharge: 2018-06-09 | Disposition: A | Discharge status: Home / Self Care | Payer: Medicaid - Out of State | Attending: Emergency Medicine | Admitting: Emergency Medicine

## 2018-06-09 DIAGNOSIS — Z885 Allergy status to narcotic agent status: Secondary | ICD-10-CM | POA: Diagnosis not present

## 2018-06-09 DIAGNOSIS — S41112D Laceration without foreign body of left upper arm, subsequent encounter: Secondary | ICD-10-CM | POA: Insufficient documentation

## 2018-06-09 DIAGNOSIS — X58XXXD Exposure to other specified factors, subsequent encounter: Secondary | ICD-10-CM | POA: Diagnosis not present

## 2018-06-09 DIAGNOSIS — Z4802 Encounter for removal of sutures: Secondary | ICD-10-CM

## 2018-06-09 DIAGNOSIS — Z881 Allergy status to other antibiotic agents status: Secondary | ICD-10-CM | POA: Diagnosis not present

## 2018-06-09 DIAGNOSIS — F17210 Nicotine dependence, cigarettes, uncomplicated: Secondary | ICD-10-CM | POA: Diagnosis not present

## 2018-06-09 NOTE — ED Provider Notes (Signed)
[facility]-EMERGENCY DEPT Provider Note   CSN: [id number] Arrival date & time: 06/09/18  1191     History   Chief Complaint Chief Complaint  Patient presents with  . Wound Check    HPI Suzanne Mccormick is a 32 y.o. female.  Patient here for suture removal from left upper extremity.  Seen here 2 weeks ago after sustaining a laceration to that area.  Denies any fever or wound drainage.     Past Medical History:  Diagnosis Date  . Kidney infection     Patient Active Problem List   Diagnosis Date Noted  . Pyelonephritis 09/05/2013  . Leukocytosis 09/05/2013    Past Surgical History:  Procedure Laterality Date  . TUBAL LIGATION       OB History   None      Home Medications    Prior to Admission medications   Medication Sig Start Date End Date Taking? Authorizing Provider  cyclobenzaprine (FLEXERIL) 5 MG tablet Take 1 tablet (5 mg total) by mouth 3 (three) times daily as needed for muscle spasms. Patient not taking: Reported on 03/21/2017 03/09/17   Janne Napoleon, NP  naproxen (NAPROSYN) 500 MG tablet Take 1 tablet (500 mg total) by mouth 2 (two) times daily. Patient not taking: Reported on 04/21/2017 03/21/17   Bethann Berkshire, MD    Family History Family History  Problem Relation Age of Onset  . Hypertension Mother     Social History Social History   Tobacco Use  . Smoking status: Current Some Day Smoker    Packs/day: 0.50    Types: Cigarettes  . Smokeless tobacco: Never Used  Substance Use Topics  . Alcohol use: Yes    Comment: occasionally   . Drug use: No     Allergies   Coconut oil; Morphine; Coconut flavor [flavoring agent]; Morphine and related; and Penicillins   Review of Systems Review of Systems  All other systems reviewed and are negative.    Physical Exam Updated Vital Signs BP (!) 145/75 (BP Location: Right Arm)   Pulse 78   Temp 98 F (36.7 C) (Oral)   Resp 20   SpO2 100%   Physical Exam    Constitutional: She is oriented to person, place, and time. She appears well-developed and well-nourished.  Non-toxic appearance. No distress.  HENT:  Head: Normocephalic and atraumatic.  Eyes: Pupils are equal, round, and reactive to light. Conjunctivae, EOM and lids are normal.  Neck: Normal range of motion. Neck supple. No tracheal deviation present. No thyroid mass present.  Cardiovascular: Normal rate, regular rhythm and normal heart sounds. Exam reveals no gallop.  No murmur heard. Pulmonary/Chest: Effort normal and breath sounds normal. No stridor. No respiratory distress. She has no decreased breath sounds. She has no wheezes. She has no rhonchi. She has no rales.  Abdominal: Soft. Normal appearance and bowel sounds are normal. She exhibits no distension. There is no tenderness. There is no rebound and no CVA tenderness.  Musculoskeletal: Normal range of motion. She exhibits no edema or tenderness.       Arms: Neurological: She is alert and oriented to person, place, and time. She has normal strength. No cranial nerve deficit or sensory deficit. GCS eye subscore is 4. GCS verbal subscore is 5. GCS motor subscore is 6.  Skin: Skin is warm and dry. No abrasion and no rash noted.  Psychiatric: She has a normal mood and affect. Her speech is normal and behavior is normal.  Nursing note  and vitals reviewed.    ED Treatments / Results  Labs (all labs ordered are listed, but only abnormal results are displayed) Labs Reviewed - No data to display  EKG None  Radiology No results found.  Procedures Procedures (including critical care time)  Medications Ordered in ED Medications - No data to display   Initial Impression / Assessment and Plan / ED Course  I have reviewed the triage vital signs and the nursing notes.  Pertinent labs & imaging results that were available during my care of the patient were reviewed by me and considered in my medical decision making (see chart for  details).     Sutures removed.  Return precautions given  Final Clinical Impressions(s) / ED Diagnoses   Final diagnoses:  None    ED Discharge Orders    None       Lorre NickAllen, Tiarra Anastacio, MD 06/09/18 1042

## 2018-06-09 NOTE — ED Triage Notes (Signed)
Patient here from home and would like wound to left arm check. Report sutures are painful.

## 2018-07-08 ENCOUNTER — Emergency Department (HOSPITAL_COMMUNITY)
Admission: EM | Admit: 2018-07-08 | Discharge: 2018-07-08 | Disposition: A | Discharge status: Home / Self Care | Payer: Medicaid - Out of State | Attending: Emergency Medicine | Admitting: Emergency Medicine

## 2018-07-08 ENCOUNTER — Encounter (HOSPITAL_COMMUNITY): Payer: Self-pay | Admitting: Emergency Medicine

## 2018-07-08 DIAGNOSIS — R1110 Vomiting, unspecified: Secondary | ICD-10-CM | POA: Diagnosis present

## 2018-07-08 DIAGNOSIS — Z5321 Procedure and treatment not carried out due to patient leaving prior to being seen by health care provider: Secondary | ICD-10-CM | POA: Insufficient documentation

## 2018-07-08 NOTE — ED Triage Notes (Signed)
Pt c/o n/v/d and productive cough with fevers for couple days. Reports her coworker had to go to ED yesterday for stomach virus. Reports chills and sweating but hasnt checked her temperature.

## 2018-07-26 ENCOUNTER — Encounter (HOSPITAL_COMMUNITY): Payer: Self-pay

## 2018-07-26 ENCOUNTER — Other Ambulatory Visit: Payer: Self-pay

## 2018-07-26 ENCOUNTER — Emergency Department (HOSPITAL_COMMUNITY)
Admission: EM | Admit: 2018-07-26 | Discharge: 2018-07-26 | Disposition: A | Discharge status: Home / Self Care | Payer: No Typology Code available for payment source | Attending: Emergency Medicine | Admitting: Emergency Medicine

## 2018-07-26 DIAGNOSIS — Z4802 Encounter for removal of sutures: Secondary | ICD-10-CM | POA: Insufficient documentation

## 2018-07-26 DIAGNOSIS — F17210 Nicotine dependence, cigarettes, uncomplicated: Secondary | ICD-10-CM | POA: Insufficient documentation

## 2018-07-26 DIAGNOSIS — Z87821 Personal history of retained foreign body fully removed: Secondary | ICD-10-CM

## 2018-07-26 DIAGNOSIS — Z1889 Other specified retained foreign body fragments: Secondary | ICD-10-CM | POA: Insufficient documentation

## 2018-07-26 HISTORY — DX: Disorder of kidney and ureter, unspecified: N28.9

## 2018-07-26 MED ORDER — LIDOCAINE-EPINEPHRINE (PF) 2 %-1:200000 IJ SOLN
1.7000 mL | Freq: Once | INTRAMUSCULAR | Status: AC
  Administered 2018-07-26: 1.7 mL via INTRADERMAL
  Filled 2018-07-26: qty 20

## 2018-07-26 MED ORDER — BENZOCAINE 20 % MT AERO: INHALATION_SPRAY | Freq: Once | OROMUCOSAL | Status: DC

## 2018-07-26 NOTE — ED Triage Notes (Signed)
Patient reports that she had sutures to the left upper arm in  November 2019. patient states she still has a suture and is painful to touch. No redness or edema noted.

## 2018-07-26 NOTE — ED Notes (Signed)
Pt came out of room and yelled at the triage nurses about why she was still waiting for her stitch removal. Pt assured that we are waiting for the provider to be available to see her and we are all working as efficiently as we can.

## 2018-07-26 NOTE — ED Provider Notes (Signed)
[facility]-EMERGENCY DEPT Provider Note   CSN: [id number] Arrival date & time: 07/26/18  1334     History   Chief Complaint Chief Complaint  Patient presents with  . Suture / Staple Removal    HPI Suzanne Mccormick is a 33 y.o. female.  HPI   33 year old female with history of renal disorder presented emergency department today for evaluation of retained suture material.  Patient had sutures placed back in November.  States she had the sutures removed however she has had intermittent continued pain in the area where she had sutures placed.  States there is a blue discoloration on her skin just above the laceration that appears to be a retained suture.  She denies any fevers today.  She is had no drainage from the wound.  There is no redness or swelling.  Past Medical History:  Diagnosis Date  . Renal disorder     Patient Active Problem List   Diagnosis Date Noted  . Pyelonephritis 09/05/2013  . Leukocytosis 09/05/2013    Past Surgical History:  Procedure Laterality Date  . TUBAL LIGATION       OB History   No obstetric history on file.      Home Medications    Prior to Admission medications   Medication Sig Start Date End Date Taking? Authorizing Provider  cyclobenzaprine (FLEXERIL) 5 MG tablet Take 1 tablet (5 mg total) by mouth 3 (three) times daily as needed for muscle spasms. Patient not taking: Reported on 03/21/2017 03/09/17   Janne NapoleonNeese, Hope M, NP  naproxen (NAPROSYN) 500 MG tablet Take 1 tablet (500 mg total) by mouth 2 (two) times daily. Patient not taking: Reported on 04/21/2017 03/21/17   Bethann BerkshireZammit, Joseph, MD    Family History Family History  Problem Relation Age of Onset  . Hypertension Mother     Social History Social History   Tobacco Use  . Smoking status: Current Some Day Smoker    Packs/day: 0.50    Types: Cigarettes  . Smokeless tobacco: Never Used  Substance Use Topics  . Alcohol use: Not Currently  . Drug use: No      Allergies   Coconut oil; Morphine; Coconut flavor [flavoring agent]; Morphine and related; and Penicillins   Review of Systems Review of Systems  Constitutional: Negative for fever.  Musculoskeletal:       Left arm pain  Skin:       Retained suture     Physical Exam Updated Vital Signs BP 119/72 (BP Location: Right Arm)   Pulse 92   Temp 98.3 F (36.8 C) (Oral)   Resp 16   Ht 5\' 7"  (1.702 m)   Wt 90.3 kg   LMP 06/29/2018   SpO2 100%   BMI 31.18 kg/m   Physical Exam Vitals signs and nursing note reviewed.  Constitutional:      General: She is not in acute distress.    Appearance: She is well-developed.  HENT:     Head: Normocephalic and atraumatic.  Eyes:     Conjunctiva/sclera: Conjunctivae normal.  Neck:     Musculoskeletal: Neck supple.  Cardiovascular:     Rate and Rhythm: Normal rate.  Pulmonary:     Effort: Pulmonary effort is normal.  Musculoskeletal: Normal range of motion.  Skin:    General: Skin is warm and dry.     Comments: Punctate blue discoloration to the skin above prior laceration. Previous laceration well healed.  No erythema, induration or fluctuance to the area of  concern.  No obvious drainage.  Neurological:     Mental Status: She is alert.      ED Treatments / Results  Labs (all labs ordered are listed, but only abnormal results are displayed) Labs Reviewed - No data to display  EKG None  Radiology No results found.  Procedures .Foreign Body Removal Date/Time: 07/26/2018 5:32 PM Performed by: Karrie Meres, PA-C Authorized by: Karrie Meres, PA-C  Consent: Verbal consent obtained. Consent given by: patient Patient identity confirmed: verbally with patient Intake: arm. Anesthesia: local infiltration  Anesthesia: Local Anesthetic: lidocaine 2% with epinephrine  Sedation: Patient sedated: no  Patient restrained: no Complexity: simple Number of foreign bodies recovered: 1. Objects recovered: 1  suture Post-procedure assessment: foreign body removed Patient tolerance: Patient tolerated the procedure well with no immediate complications   (including critical care time)  Medications Ordered in ED Medications  Benzocaine (HURRCAINE) 20 % mouth spray (has no administration in time range)  lidocaine-EPINEPHrine (XYLOCAINE W/EPI) 2 %-1:200000 (PF) injection 1.7 mL (1.7 mLs Intradermal Given 07/26/18 1647)     Initial Impression / Assessment and Plan / ED Course  I have reviewed the triage vital signs and the nursing notes.  Pertinent labs & imaging results that were available during my care of the patient were reviewed by me and considered in my medical decision making (see chart for details).      Final Clinical Impressions(s) / ED Diagnoses   Final diagnoses:  H/O retained foreign body fully removed   Patient presenting with request of suture removal stating that she has a retained suture in her left upper extremity after she had them placed in November.  She does have a small punctate area that is blue just underneath the skin above the previous laceration.  There are no signs of infection surrounding this.  Discussed the possibility of making incision to remove this versus conservative therapy, patient would prefer to try to remove the retained suture.  Area was numbed, incision was made and foreign body was removed. Wound dressed with band-aid. Advised to f/u if continued pain or signs of infection occur.  ED Discharge Orders    None       Rayne Du 07/26/18 1734    Gerhard Munch, MD 07/26/18 2333
